# Patient Record
Sex: Male | Born: 1990
Health system: Southern US, Community
[De-identification: ages and names within clinical notes are randomized; demographics above are authoritative.]

## PROBLEM LIST (undated history)

## (undated) DIAGNOSIS — I10 Essential (primary) hypertension: Secondary | ICD-10-CM

---
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
## 2014-07-28 ENCOUNTER — Emergency Department (HOSPITAL_COMMUNITY): Payer: Managed Care, Other (non HMO)

## 2014-07-28 ENCOUNTER — Encounter (HOSPITAL_COMMUNITY): Payer: Self-pay

## 2014-07-28 ENCOUNTER — Emergency Department (HOSPITAL_COMMUNITY)
Admission: EM | Admit: 2014-07-28 | Discharge: 2014-07-28 | Disposition: A | Discharge status: Home / Self Care | Payer: Managed Care, Other (non HMO) | Attending: Emergency Medicine | Admitting: Emergency Medicine

## 2014-07-28 DIAGNOSIS — R61 Generalized hyperhidrosis: Secondary | ICD-10-CM | POA: Insufficient documentation

## 2014-07-28 DIAGNOSIS — R0602 Shortness of breath: Secondary | ICD-10-CM | POA: Insufficient documentation

## 2014-07-28 DIAGNOSIS — R062 Wheezing: Secondary | ICD-10-CM | POA: Insufficient documentation

## 2014-07-28 DIAGNOSIS — R079 Chest pain, unspecified: Secondary | ICD-10-CM

## 2014-07-28 DIAGNOSIS — I10 Essential (primary) hypertension: Secondary | ICD-10-CM | POA: Diagnosis not present

## 2014-07-28 DIAGNOSIS — Z720 Tobacco use: Secondary | ICD-10-CM | POA: Diagnosis not present

## 2014-07-28 HISTORY — DX: Essential (primary) hypertension: I10

## 2014-07-28 LAB — CBC
HCT: 49.2 % (ref 39.0–52.0)
Hemoglobin: 17.2 g/dL — ABNORMAL HIGH (ref 13.0–17.0)
MCH: 29.7 pg (ref 26.0–34.0)
MCHC: 35 g/dL (ref 30.0–36.0)
MCV: 84.8 fL (ref 78.0–100.0)
Platelets: 240 10*3/uL (ref 150–400)
RBC: 5.8 MIL/uL (ref 4.22–5.81)
RDW: 12.5 % (ref 11.5–15.5)
WBC: 10.7 10*3/uL — AB (ref 4.0–10.5)

## 2014-07-28 LAB — BASIC METABOLIC PANEL
ANION GAP: 16 — AB (ref 5–15)
BUN: 11 mg/dL (ref 6–20)
CALCIUM: 10.6 mg/dL — AB (ref 8.9–10.3)
CHLORIDE: 103 mmol/L (ref 101–111)
CO2: 22 mmol/L (ref 22–32)
CREATININE: 0.99 mg/dL (ref 0.61–1.24)
GFR calc Af Amer: >60 mL/min (ref >60)
GLUCOSE: 94 mg/dL (ref 65–99)
POTASSIUM: 3.4 mmol/L — AB (ref 3.5–5.1)
SODIUM: 141 mmol/L (ref 135–145)

## 2014-07-28 LAB — I-STAT TROPONIN, ED
TROPONIN I, POC: 0 ng/mL (ref 0.00–0.08)
Troponin i, poc: 0 ng/mL (ref 0.00–0.08)

## 2014-07-28 LAB — BRAIN NATRIURETIC PEPTIDE: B NATRIURETIC PEPTIDE 5: 16.3 pg/mL (ref 0.0–100.0)

## 2014-07-28 MED ORDER — MORPHINE SULFATE 4 MG/ML IJ SOLN
4.0000 mg | Freq: Once | INTRAMUSCULAR | Status: AC
  Administered 2014-07-28: 4 mg via INTRAVENOUS
  Filled 2014-07-28: qty 1

## 2014-07-28 MED ORDER — ALBUTEROL SULFATE (2.5 MG/3ML) 0.083% IN NEBU
2.5000 mg | INHALATION_SOLUTION | Freq: Once | RESPIRATORY_TRACT | Status: AC
  Administered 2014-07-28: 2.5 mg via RESPIRATORY_TRACT
  Filled 2014-07-28: qty 3

## 2014-07-28 MED ORDER — ASPIRIN 81 MG PO CHEW: 324.0000 mg | CHEWABLE_TABLET | Freq: Once | ORAL | Status: DC

## 2014-07-28 NOTE — Discharge Instructions (Signed)
Chest Pain (Nonspecific) Return for chest pain with shortness of breath or diaphoresis. Follow-up with a provider using the resource guide below. Take ibuprofen or Tylenol for pain. It is often hard to give a diagnosis for the cause of chest pain. There is always a chance that your pain could be related to something serious, such as a heart attack or a blood clot in the lungs. You need to follow up with your doctor. HOME CARE  If antibiotic medicine was given, take it as directed by your doctor. Finish the medicine even if you start to feel better.  For the next few days, avoid activities that bring on chest pain. Continue physical activities as told by your doctor.  Do not use any tobacco products. This includes cigarettes, chewing tobacco, and e-cigarettes.  Avoid drinking alcohol.  Only take medicine as told by your doctor.  Follow your doctor's suggestions for more testing if your chest pain does not go away.  Keep all doctor visits you made. GET HELP IF:  Your chest pain does not go away, even after treatment.  You have a rash with blisters on your chest.  You have a fever. GET HELP RIGHT AWAY IF:   You have more pain or pain that spreads to your arm, neck, jaw, back, or belly (abdomen).  You have shortness of breath.  You cough more than usual or cough up blood.  You have very bad back or belly pain.  You feel sick to your stomach (nauseous) or throw up (vomit).  You have very bad weakness.  You pass out (faint).  You have chills. This is an emergency. Do not wait to see if the problems will go away. Call your local emergency services (911 in U.S.). Do not drive yourself to the hospital. MAKE SURE YOU:   Understand these instructions.  Will watch your condition.  Will get help right away if you are not doing well or get worse. Document Released: 06/28/2007 Document Revised: 01/14/2013 Document Reviewed: 06/28/2007 [facility] Patient Information 2015 [place],  [place]. This information is not intended to replace advice given to you by your health care provider. Make sure you discuss any questions you have with your health care provider.  Emergency Department Resource Guide 1) Find a Doctor and Pay Out of Pocket Although you won't have to find out who is covered by your insurance plan, it is a good idea to ask around and get recommendations. You will then need to call the office and see if the doctor you have chosen will accept you as a new patient and what types of options they offer for patients who are self-pay. Some doctors offer discounts or will set up payment plans for their patients who do not have insurance, but you will need to ask so you aren't surprised when you get to your appointment.  2) Contact Your Local Health Department Not all health departments have doctors that can see patients for sick visits, but many do, so it is worth a call to see if yours does. If you don't know where your local health department is, you can check in your phone book. The CDC also has a tool to help you locate your state's health department, and many state websites also have listings of all of their local health departments.  3) Find a Walk-in Clinic If your illness is not likely to be very severe or complicated, you may want to try a walk in clinic. These are popping up all over  the country in pharmacies, drugstores, and shopping centers. They're usually staffed by nurse practitioners or physician assistants that have been trained to treat common illnesses and complaints. They're usually fairly quick and inexpensive. However, if you have serious medical issues or chronic medical problems, these are probably not your best option.  No Primary Care Doctor: - Call Health Connect at  [phone] - they can help you locate a primary care doctor that  accepts your insurance, provides certain services, etc. - Physician Referral Service- [phone]  Chronic Pain  Problems: Organization         Address  Phone   Notes  [facility]  [phone] Patients need to be referred by their primary care doctor.   Medication Assistance: Organization         Address  Phone   Notes  [facility]ication [facility] [address] [place], [place] [address] [phone]) [phone] --Must be a resident of [place] [facility] -- Must have NO insurance coverage whatsoever (no Medicaid/ Medicare, etc.) -- The pt. MUST have a primary care doctor that directs their care regularly and follows them in the community   MedAssist  [phone]   [employer]  [phone]    Agencies that provide inexpensive medical care: Organization         Address  Phone   Notes  [facility]  [phone]   [facility]    [phone]) [phone]   [facility] [address] [place], [place] [address] [phone]   [facility] [address], [place] [phone]) [phone]   Planned Parenthood    [phone]) [phone]   [facility]    [phone]   Community Health and [facility]  [address], [place] Phone:  [phone], Fax:  [phone] Hours of Operation:  9 am - 6 pm, M-F.  Also accepts Medicaid/Medicare and self-pay.  [facility]  [address], [place] Phone: [phone](336) [phone], Fax: [phone]) [phone]. Hours of Operation:  8:30 am - 5:30 pm, M-F.  Also accepts Medicaid and self-pay.  [facility] [place] [address], [place] Phone: [phone]) [phone]   [facility] [address], [place], [place] [phone], Ext. [phone] Mondays & Thursdays: 7-9 AM.  First 15 patients are seen on a first come, first serve basis.    Medicaid-accepting [facility] Providers:  Organization         Address  Phone   Notes  [facility] [address], [place] [phone]) [phone] Also  accepts self-pay patients.  [facility] [address] [place], [place]  [phone]   [facility] [address], [place] [phone]) [phone]   [facility] [address] [place] [address], [place] [phone]) [phone]   Renaye RakersVeita Bland [address], [place]   [phone]) [phone] Only accepts [place] Access [place] patients after they have their name applied to their card.   Self-Pay (no insurance) in [facility]:  Organization         Address  Phone   Notes  Sickle Cell Patients, [facility] [address], [place] [phone]) [phone]   [facility] [address], [place] [phone]   [facility] [place]  [address], [place] [phone]   [facility]/Dr. Osei-Bonsu  [address], [place] or [address], [place] [phone] Phone number for both [place] and [place] locations is the same.  [facility] [address], [place] [phone]   [facility] [address], [place] or [address] [phone]   [facility] [address] [place] [phone], phone; [phone], fax Sees patients 1st and 3rd Saturday of every month.  Must not qualify for public or private insurance (i.e. Medicaid, Medicare, [place] Health Choice, Veterans' Benefits)  Household income should be no more than 200% of the poverty level The clinic cannot treat you if you are pregnant or think you are pregnant  Sexually transmitted diseases are not treated at the clinic.    Dental Care: Organization         Address  Phone  Notes  [facility] of [facility] [address] [place], [place] [phone] Accepts children up to age 3 who are enrolled in [place] or [place] Health Choice; pregnant  women with a Medicaid card; and children who have applied for Medicaid or [place] Health Choice, but were declined, whose parents can pay a reduced fee at time of service.  [facility] of [facility]  [address], [place] [phone] Accepts children up to age 20 who are enrolled in [place] or [place] Health Choice; pregnant women with a Medicaid card; and children who have applied for Medicaid or  Health Choice, but were declined, whose parents can pay a reduced fee at time of service.  [facility]  [address] [place], [place] [phone] Patients are seen by appointment only. Walk-ins are not accepted. [facility] will see patients 75 years of age and older. Monday - Tuesday (8am-5pm) Most Wednesdays (8:30-5pm) $30 per visit, cash only  [facility] Adult Dental Access PROGRAM  [address], [facility] [phone] Patients are seen by appointment only. Walk-ins are not accepted. [facility] will see patients 49 years of age and older. One Wednesday Evening (Monthly: Volunteer Based).  $30 per visit, cash only  [employer]  [phone] for adults; Children under age 30, call Graduate Pediatric Dentistry at [phone]. Children aged 76-14, please call [phone] to request a pediatric application.  Dental services are provided in all areas of dental care including fillings, crowns and bridges, complete and partial dentures, implants, gum treatment, root canals, and extractions. Preventive care is also provided. Treatment is provided to both adults and children. Patients are selected via a lottery and there is often a waiting list.   [facility] [address] [place]  [phone] [email or web address]   [facility] [address] [employer], [place] [phone], Ext. [phone] Second and Fourth Thursday of each month, opens at 6:30 AM; Clinic ends at 9 AM.  Patients are  seen on a first-come first-served basis, and a limited number are seen during each clinic.   [facility]  [address] [place] [place], [place] [phone]   Eligibility Requirements You must have lived in [place], [place], or [place] counties for at least the last three months.   You cannot be eligible for state or federal sponsored [place], including [employer], [place], or [employer].  You generally cannot be eligible for healthcare insurance through your employer.    How to apply: Eligibility screenings are held every Tuesday and Wednesday afternoon from 1:00 pm until 4:00 pm. You do not need an appointment for the interview!  [facility] [address] [place], [place]   [place]  [place] Department  [place]  [phone]    Behavioral Health Resources in the Community: Intensive Outpatient Programs Organization         Address  Phone  Notes  [place]. [address] [place], [place] [phone]   [facility] Outpatient [address] [place], [place]   ADS: Alcohol & Drug Svcs [address], [place], [place]   [place] [address]  [place], [place] or [phone]   Substance Abuse Resources Organization         Address  Phone  Notes  Alcohol and Drug Services  [phone]   [place]  [phone]   The [facility]   Chinita Pester  [phone]   Residential & Outpatient Substance Abuse Program  [phone]   Psychological Services Organization         Address  Phone  Notes  [facility] [place]  [place]  [phone]   [place] [address] [place] or [phone]    Mobile Crisis  Teams Organization         Address  Phone  Notes  [facility], Mobile Crisis Care Unit  [phone]   [facility]  [address] [place], [place]   [place] Levels [address] [place] [phone]    Self-Help/Support Groups Organization         Address  Phone             Notes  [place]. of [place] - variety of support groups  [place] Call for more information  Narcotics Anonymous (NA), [facility] [address], [employer] [place]  2 meetings at this location   Special educational needs teacher         Address  Phone  Notes  [facility]AP Residential Treatment [place],    [place]  [phone]   [facility]  [address], [place] [phone], [place], [place]   [place], [place] [phone] Admissions: 8am-3pm M-F  Incentives Substance [place] [address],    [place], [place] [phone]   [facility] [address] [place], [place], [place]   [facility] [address]  [place], [place]   Insight Programs - Intensive Outpatient [address], [place], [place]   [facility] ([place].) [place].,  [place], [place] [phone] or [phone]   [facility] (RTS) [address], [place], [place] Accepts Medicaid  [facility] [address],  [place] [phone] Substance Abuse/Addiction Treatment   [facility] Organization         Address  Phone  Notes  [facility]  [phone]   Domenic Schwab, PhD [address] [place], [place]   [phone] or [phone]   [facility]   [phone]  [address] [place], [place] [phone]   [facility] [address] [place], [place] [phone]  Insurance/Medicaid/sponsorship through [facility] [address], [address] [place], [place] [phone] [place], [place] [phone]    Dr. Adele Schilder  [phone]   [facility] Dept. 1) [address], [place] 2) [place] 3)  [place] [address], [place] [phone]  [phone]   [place] [phone] or [phone] (After Hours)

## 2014-07-28 NOTE — Progress Notes (Signed)
EDCM spoke to patient at bedside.  Patient confirms he has [place] insurance without a pcp.  EDCM provided patient with list of pcps who accept patient's insurance within a ten mile radius of patient's zip code.  Discussed importance and purpose of having a pcp.  Patient thankful for resources.  No further EDCM needs at this time.

## 2014-07-28 NOTE — ED Notes (Signed)
Pt c/o intermittent central chest pain, SOB, and lightheadedness starting around 1030.  Pain score 10/10.  Pt has not taken anything for pain.  Pt reports working outside when pain started.  Hx of smoking and HTN.

## 2014-07-28 NOTE — ED Provider Notes (Signed)
CSN: [id number]    [id number] Arrival date & time 07/28/14  1409 History   First MD Initiated Contact with Patient 07/28/14 1604     Chief Complaint  Patient presents with  . Chest Pain  . Shortness of Breath     (Consider location/radiation/quality/duration/timing/severity/associated sxs/prior Treatment) Patient is a 24 y.o. male presenting with chest pain and shortness of breath. The history is provided by the patient. No language interpreter was used.  Chest Pain Associated symptoms: diaphoresis and shortness of breath   Associated symptoms: no abdominal pain, no nausea, not vomiting and no weakness   Shortness of Breath Associated symptoms: chest pain and diaphoresis   Associated symptoms: no abdominal pain and no vomiting   Marc Hebert is a 24 year old male with a history of hypertension who presents for chest pain that began at 10:30AM this morning while at work. He states it came on suddenly and was 10 out of 10. He states he was also diaphoretic and short of breath. The pain is nonradiating but constant in the center of his chest. He states that while waiting in the lobby the pain went down to a 6 out of 10. He denies taking anything prior to arrival. He denies that it is worse with breathing but does state that it is worse with movement. He denies any fever, chills, recent illness, cough, abdominal pain, nausea, vomiting, diarrhea, leg swelling. He smokes one pack per day of cigarettes. No family cardiac history. No recent travel or surgery. No history of DVT or PE. No history of asthma. He does not take any medication for hypertension.  Past Medical History  Diagnosis Date  . Hypertension    History reviewed. No pertinent past surgical history. History reviewed. No pertinent family history. History  Substance Use Topics  . Smoking status: Current Every Day Smoker -- 1.00 packs/day    Types: Cigarettes  . Smokeless tobacco: Not on file  . Alcohol Use: Yes     Comment: rarely     Review of Systems  Constitutional: Positive for diaphoresis.  Respiratory: Positive for shortness of breath.   Cardiovascular: Positive for chest pain.  Gastrointestinal: Negative for nausea, vomiting and abdominal pain.  Neurological: Negative for weakness.  All other systems reviewed and are negative.     Allergies  Review of patient's allergies indicates no known allergies.  Home Medications   Prior to Admission medications   Not on File   BP 110/90 mmHg  Pulse 92  Temp(Src) 98.9 F (37.2 C) (Oral)  Resp 18  SpO2 98% Physical Exam  Constitutional: He is oriented to person, place, and time. He appears well-developed and well-nourished.  HENT:  Head: Normocephalic and atraumatic.  Eyes: Conjunctivae are normal.  Neck: Normal range of motion. Neck supple.  Cardiovascular: Normal rate, regular rhythm and normal heart sounds.   Pulmonary/Chest: Effort normal. No accessory muscle usage. No respiratory distress. He has no decreased breath sounds. He has wheezes. He has no rales.  Wheezing throughout bilateral lung fields.   Abdominal: Soft. There is no tenderness.  Musculoskeletal: Normal range of motion. He exhibits no edema.  No calf tenderness.   Neurological: He is alert and oriented to person, place, and time.  Skin: Skin is warm and dry.  Psychiatric: He has a normal mood and affect. His behavior is normal.  Nursing note and vitals reviewed.   ED Course  Procedures (including critical care time) Labs Review Labs Reviewed  CBC - Abnormal; Notable for the following:  WBC 10.7 (*)    Hemoglobin 17.2 (*)    All other components within normal limits  BASIC METABOLIC PANEL - Abnormal; Notable for the following:    Potassium 3.4 (*)    Calcium 10.6 (*)    Anion gap 16 (*)    All other components within normal limits  BRAIN NATRIURETIC PEPTIDE  I-STAT TROPOININ, ED  Rosezena Sensor, ED    Imaging Review Dg Chest 2 View  07/28/2014   CLINICAL DATA:   Patient with intermittent chest pain and shortness of breath.  EXAM: CHEST  2 VIEW  COMPARISON:  None.  FINDINGS: Normal cardiac and mediastinal contours. No consolidative pulmonary opacities. No pleural effusion or pneumothorax. Regional skeleton is unremarkable. Bilateral nipple piercing.  IMPRESSION: No acute cardiopulmonary process.   Electronically Signed   By: Annia Belt M.D.   On: 07/28/2014 17:13   EKG Interpretation 07/28/14 14:20:20 Vent. rate 98 BPM PR interval 136 ms QRS duration 79 ms QT/QTc 335/428 ms P-R-T axes 41 39 71 Normal sinus rhythm. I agree with the EKG interpretation, Catha Gosselin, PA-C. MDM   Final diagnoses:  Chest pain, unspecified chest pain type   98% oxygen on room air. Not tachycardic or afebrile. Labs are not concerning. BMP is negative for CHF. Troponin is negative. EKG is not concerning for WPW. Repeat troponin is also negative.  Patient is PERC negative. His pain is worse with movement which is atypical for cardiac pain. I reviewed the heart score which puts him at low probability for a major cardiac event. I discussed return precautions with him such as chest pain with shortness of breath and diaphoresis. Patient verbally agrees with the plan.  Medications  albuterol (PROVENTIL) (2.5 MG/3ML) 0.083% nebulizer solution 2.5 mg (2.5 mg Nebulization Given 07/28/14 1630)  morphine 4 MG/ML injection 4 mg (4 mg Intravenous Given 07/28/14 1750)       Catha Gosselin, PA-C 07/29/14 0215  Derwood Kaplan, MD 07/29/14 1610

## 2015-12-07 ENCOUNTER — Emergency Department (HOSPITAL_COMMUNITY): Payer: Managed Care, Other (non HMO)

## 2015-12-07 ENCOUNTER — Emergency Department (HOSPITAL_COMMUNITY)
Admission: EM | Admit: 2015-12-07 | Discharge: 2015-12-08 | Disposition: A | Discharge status: Home / Self Care | Payer: Managed Care, Other (non HMO) | Attending: Emergency Medicine | Admitting: Emergency Medicine

## 2015-12-07 ENCOUNTER — Encounter (HOSPITAL_COMMUNITY): Payer: Self-pay | Admitting: Emergency Medicine

## 2015-12-07 DIAGNOSIS — F17220 Nicotine dependence, chewing tobacco, uncomplicated: Secondary | ICD-10-CM | POA: Insufficient documentation

## 2015-12-07 DIAGNOSIS — I10 Essential (primary) hypertension: Secondary | ICD-10-CM | POA: Insufficient documentation

## 2015-12-07 DIAGNOSIS — Y92410 Unspecified street and highway as the place of occurrence of the external cause: Secondary | ICD-10-CM | POA: Insufficient documentation

## 2015-12-07 DIAGNOSIS — Y999 Unspecified external cause status: Secondary | ICD-10-CM | POA: Insufficient documentation

## 2015-12-07 DIAGNOSIS — F17210 Nicotine dependence, cigarettes, uncomplicated: Secondary | ICD-10-CM | POA: Insufficient documentation

## 2015-12-07 DIAGNOSIS — Y9389 Activity, other specified: Secondary | ICD-10-CM | POA: Insufficient documentation

## 2015-12-07 DIAGNOSIS — S8001XA Contusion of right knee, initial encounter: Secondary | ICD-10-CM

## 2015-12-07 MED ORDER — IBUPROFEN 800 MG PO TABS
800.0000 mg | ORAL_TABLET | Freq: Once | ORAL | Status: AC
  Administered 2015-12-07: 800 mg via ORAL
  Filled 2015-12-07: qty 1

## 2015-12-07 MED ORDER — HYDROCODONE-ACETAMINOPHEN 5-325 MG PO TABS
1.0000 | ORAL_TABLET | Freq: Once | ORAL | Status: AC
  Administered 2015-12-07: 1 via ORAL
  Filled 2015-12-07: qty 1

## 2015-12-07 MED ORDER — METHOCARBAMOL 500 MG PO TABS
1000.0000 mg | ORAL_TABLET | Freq: Once | ORAL | Status: AC
  Administered 2015-12-07: 1000 mg via ORAL
  Filled 2015-12-07: qty 2
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
## 2015-12-08 MED ORDER — METHOCARBAMOL 500 MG PO TABS: 500.0000 mg | ORAL_TABLET | Freq: Three times a day (TID) | ORAL | 0 refills | Status: DC

## 2015-12-08 MED ORDER — IBUPROFEN 600 MG PO TABS: 600.0000 mg | ORAL_TABLET | Freq: Four times a day (QID) | ORAL | 1 refills | Status: DC | PRN

## 2015-12-08 NOTE — ED Notes (Signed)
Pt alert & oriented x4, stable gait. Patient given discharge instructions, paperwork & prescription(s). Patient  instructed to stop at the registration desk to finish any additional paperwork. Patient verbalized understanding. Pt left department w/ no further questions. 

## 2015-12-08 NOTE — Discharge Instructions (Signed)
Your xray of the right knee is negative for fracture or dislocation. No fluid in the joint. Please use ibuprofen every 6 hours. Use robaxin for muscle strain and muscle contraction headache.This medication may cause drowsiness. Please do not drink, drive, or participate in activity that requires concentration while taking this medication.

## 2016-05-31 ENCOUNTER — Inpatient Hospital Stay (HOSPITAL_COMMUNITY)
Admission: EM | Admit: 2016-05-31 | Discharge: 2016-06-02 | DRG: 159 | Disposition: A | Discharge status: Home / Self Care | Payer: Self-pay | Attending: Student in an Organized Health Care Education/Training Program | Admitting: Student in an Organized Health Care Education/Training Program

## 2016-05-31 ENCOUNTER — Emergency Department (HOSPITAL_COMMUNITY): Payer: Self-pay

## 2016-05-31 ENCOUNTER — Encounter (HOSPITAL_COMMUNITY): Payer: Self-pay | Admitting: Emergency Medicine

## 2016-05-31 DIAGNOSIS — F17290 Nicotine dependence, other tobacco product, uncomplicated: Secondary | ICD-10-CM

## 2016-05-31 DIAGNOSIS — F17210 Nicotine dependence, cigarettes, uncomplicated: Secondary | ICD-10-CM | POA: Diagnosis present

## 2016-05-31 DIAGNOSIS — J390 Retropharyngeal and parapharyngeal abscess: Secondary | ICD-10-CM

## 2016-05-31 DIAGNOSIS — L0291 Cutaneous abscess, unspecified: Secondary | ICD-10-CM

## 2016-05-31 DIAGNOSIS — Z806 Family history of leukemia: Secondary | ICD-10-CM

## 2016-05-31 DIAGNOSIS — Z8249 Family history of ischemic heart disease and other diseases of the circulatory system: Secondary | ICD-10-CM

## 2016-05-31 DIAGNOSIS — I10 Essential (primary) hypertension: Secondary | ICD-10-CM | POA: Diagnosis present

## 2016-05-31 DIAGNOSIS — R252 Cramp and spasm: Secondary | ICD-10-CM

## 2016-05-31 DIAGNOSIS — K047 Periapical abscess without sinus: Principal | ICD-10-CM | POA: Diagnosis present

## 2016-05-31 DIAGNOSIS — Z8269 Family history of other diseases of the musculoskeletal system and connective tissue: Secondary | ICD-10-CM

## 2016-05-31 DIAGNOSIS — R229 Localized swelling, mass and lump, unspecified: Secondary | ICD-10-CM

## 2016-05-31 DIAGNOSIS — E669 Obesity, unspecified: Secondary | ICD-10-CM | POA: Diagnosis present

## 2016-05-31 DIAGNOSIS — Z6828 Body mass index (BMI) 28.0-28.9, adult: Secondary | ICD-10-CM

## 2016-05-31 DIAGNOSIS — F17220 Nicotine dependence, chewing tobacco, uncomplicated: Secondary | ICD-10-CM | POA: Diagnosis present

## 2016-05-31 LAB — MRSA PCR SCREENING: MRSA BY PCR: NEGATIVE

## 2016-05-31 LAB — I-STAT CHEM 8, ED
BUN: 9 mg/dL (ref 6–20)
Calcium, Ion: 1.1 mmol/L — ABNORMAL LOW (ref 1.15–1.40)
Chloride: 99 mmol/L — ABNORMAL LOW (ref 101–111)
Creatinine, Ser: 1.1 mg/dL (ref 0.61–1.24)
Glucose, Bld: 101 mg/dL — ABNORMAL HIGH (ref 65–99)
HEMATOCRIT: 47 % (ref 39.0–52.0)
HEMOGLOBIN: 16 g/dL (ref 13.0–17.0)
Potassium: 3.8 mmol/L (ref 3.5–5.1)
SODIUM: 139 mmol/L (ref 135–145)
TCO2: 29 mmol/L (ref 0–100)

## 2016-05-31 LAB — CBC WITH DIFFERENTIAL/PLATELET
BASOS ABS: 0 10*3/uL (ref 0.0–0.1)
BASOS PCT: 0 %
Eosinophils Absolute: 0 10*3/uL (ref 0.0–0.7)
Eosinophils Relative: 0 %
HEMATOCRIT: 46.6 % (ref 39.0–52.0)
Hemoglobin: 16.2 g/dL (ref 13.0–17.0)
Lymphocytes Relative: 16 %
Lymphs Abs: 1.9 10*3/uL (ref 0.7–4.0)
MCH: 30.9 pg (ref 26.0–34.0)
MCHC: 34.8 g/dL (ref 30.0–36.0)
MCV: 88.8 fL (ref 78.0–100.0)
MONO ABS: 1.8 10*3/uL — AB (ref 0.1–1.0)
Monocytes Relative: 14 %
NEUTROS ABS: 8.7 10*3/uL — AB (ref 1.7–7.7)
NEUTROS PCT: 70 %
Platelets: 201 10*3/uL (ref 150–400)
RBC: 5.25 MIL/uL (ref 4.22–5.81)
RDW: 12.7 % (ref 11.5–15.5)
WBC: 12.4 10*3/uL — AB (ref 4.0–10.5)

## 2016-05-31 MED ORDER — ENOXAPARIN SODIUM 40 MG/0.4ML ~~LOC~~ SOLN
40.0000 mg | SUBCUTANEOUS | Status: DC
  Filled 2016-05-31 (×3): qty 0.4

## 2016-05-31 MED ORDER — ONDANSETRON HCL 4 MG/2ML IJ SOLN: 4.0000 mg | Freq: Four times a day (QID) | INTRAMUSCULAR | Status: DC | PRN

## 2016-05-31 MED ORDER — HYDROMORPHONE HCL 1 MG/ML IJ SOLN
0.5000 mg | INTRAMUSCULAR | Status: DC | PRN
  Administered 2016-05-31 – 2016-06-02 (×11): 0.5 mg via INTRAVENOUS
  Filled 2016-05-31 (×11): qty 1

## 2016-05-31 MED ORDER — KETOROLAC TROMETHAMINE 30 MG/ML IJ SOLN
30.0000 mg | Freq: Four times a day (QID) | INTRAMUSCULAR | Status: DC | PRN
Start: 2016-05-31 — End: 2016-06-02
  Administered 2016-05-31 – 2016-06-02 (×7): 30 mg via INTRAVENOUS
  Filled 2016-05-31 (×7): qty 1

## 2016-05-31 MED ORDER — KETOROLAC TROMETHAMINE 15 MG/ML IJ SOLN: 15.0000 mg | Freq: Once | INTRAMUSCULAR | Status: DC

## 2016-05-31 MED ORDER — IOPAMIDOL (ISOVUE-300) INJECTION 61%
INTRAVENOUS | Status: AC
  Administered 2016-05-31: 75 mL
  Filled 2016-05-31: qty 75

## 2016-05-31 MED ORDER — DEXAMETHASONE SODIUM PHOSPHATE 10 MG/ML IJ SOLN
10.0000 mg | Freq: Once | INTRAMUSCULAR | Status: AC
  Administered 2016-05-31: 10 mg via INTRAVENOUS
  Filled 2016-05-31: qty 1

## 2016-05-31 MED ORDER — SODIUM CHLORIDE 0.9 % IV BOLUS (SEPSIS)
1000.0000 mL | Freq: Once | INTRAVENOUS | Status: AC
Start: 2016-05-31 — End: 2016-05-31
  Administered 2016-05-31: 1000 mL via INTRAVENOUS

## 2016-05-31 MED ORDER — KCL IN DEXTROSE-NACL 20-5-0.45 MEQ/L-%-% IV SOLN
INTRAVENOUS | Status: AC
  Administered 2016-05-31 (×2): via INTRAVENOUS
  Filled 2016-05-31 (×4): qty 1000

## 2016-05-31 MED ORDER — MORPHINE SULFATE (PF) 4 MG/ML IV SOLN
8.0000 mg | Freq: Once | INTRAVENOUS | Status: AC
  Administered 2016-05-31: 8 mg via INTRAVENOUS
  Filled 2016-05-31: qty 2

## 2016-05-31 MED ORDER — SODIUM CHLORIDE 0.9% FLUSH
3.0000 mL | Freq: Two times a day (BID) | INTRAVENOUS | Status: DC
  Administered 2016-05-31 (×2): 3 mL via INTRAVENOUS

## 2016-05-31 MED ORDER — ONDANSETRON HCL 4 MG PO TABS: 4.0000 mg | ORAL_TABLET | Freq: Four times a day (QID) | ORAL | Status: DC | PRN

## 2016-05-31 MED ORDER — CLINDAMYCIN PHOSPHATE 600 MG/50ML IV SOLN
600.0000 mg | Freq: Once | INTRAVENOUS | Status: AC
  Administered 2016-05-31: 600 mg via INTRAVENOUS
  Filled 2016-05-31: qty 50

## 2016-05-31 MED ORDER — SENNOSIDES-DOCUSATE SODIUM 8.6-50 MG PO TABS
1.0000 | ORAL_TABLET | Freq: Every evening | ORAL | Status: DC | PRN
  Filled 2016-05-31: qty 1

## 2016-05-31 MED ORDER — DEXAMETHASONE SODIUM PHOSPHATE 4 MG/ML IJ SOLN
4.0000 mg | Freq: Four times a day (QID) | INTRAMUSCULAR | Status: DC
  Administered 2016-05-31 – 2016-06-01 (×4): 4 mg via INTRAVENOUS
  Filled 2016-05-31 (×4): qty 1

## 2016-05-31 MED ORDER — ACETAMINOPHEN 650 MG RE SUPP: 650.0000 mg | Freq: Four times a day (QID) | RECTAL | Status: DC | PRN

## 2016-05-31 MED ORDER — SODIUM CHLORIDE 0.9 % IV SOLN
3.0000 g | Freq: Four times a day (QID) | INTRAVENOUS | Status: DC
  Administered 2016-05-31 – 2016-06-02 (×8): 3 g via INTRAVENOUS
  Filled 2016-05-31 (×10): qty 3

## 2016-05-31 MED ORDER — ACETAMINOPHEN 325 MG PO TABS: 650.0000 mg | ORAL_TABLET | Freq: Four times a day (QID) | ORAL | Status: DC | PRN

## 2016-05-31 NOTE — ED Notes (Signed)
Attempted report 

## 2016-05-31 NOTE — H&P (Signed)
Date: 05/31/2016               Patient Name:  Marc NieceMatthew G Bonzo MRN: [id number]  DOB: 04/27/1990 Age / Sex: 26 y.o., male   PCP: Patient, No Pcp Per         Medical Service: Internal Medicine Teaching Service         Attending Physician: Dr. Oswaldo DoneVincent, Marquita Palmsuncan Thomas, *    First Contact: Dr. Samuella CotaSvalina Pager: [phone]  Second Contact: Dr. Dimple Caseyice Pager: [phone]       After Hours (After 5p/  First Contact Pager: [phone]  weekends / holidays): Second Contact Pager: [phone]   Chief Complaint: left lower jaw pain  History of Present Illness: 26 year old man with history of HTN, tobacco use presenting with dental pain x 2.5 days. Denies pain similar to this before. Located along the left lower jaw. Has swelling along that area as well. Cannot open mouth wide for the last 1.5 days. No relieving factors. Trying to open his mouth makes it worse. Also hurts with coughing and swallowing. No fevers. He has chills. He has had dental pain overall for the last 2-3 years but the pain has been intermittent.  No vision changes. No dyspnea, chest pain, nausea/vomiting, abdominal pain, diarrhea. No melena or hematochezia. No dysuria. No rash. No paresthesias.  Meds:  No home medications  Allergies: Allergies as of 05/31/2016  . (No Known Allergies)   Past Medical History:  Diagnosis Date  . Hypertension     Family History:  Mother is alive. She has HTN, OA. Father is alive. He has HTN. Brother is alive and has HTN. 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 He also uses chew.  Drinks 24 pack of beer every weekend.  Denies illicits  Review of Systems: A complete ROS was negative except as per HPI.   Physical Exam: Blood pressure 131/85, pulse 76, temperature 98.6 F (37 C), temperature source Oral, resp. rate 18, height 6\' 3"  (1.905 m), weight 215 lb (97.5 kg), SpO2 95 %. General Apperance: NAD Head:  Normocephalic, atraumatic Eyes: PERRL, EOMI, anicteric sclera Ears: Normal external ear canal Nose: Nares normal, septum midline, mucosa normal Throat: Lips, mucosa and tongue normal. Left lower posterior molar with caries. Neck: Supple, trachea midline, tender to palpation left lower mandible and submandibular space Back: No tenderness or bony abnormality  Lungs: Clear to auscultation bilaterally. No wheezes, rhonchi or rales. Breathing comfortably Chest Wall: Nontender, no deformity Heart: Regular rate and rhythm, no murmur/rub/gallop Abdomen: Soft, nontender, nondistended, no rebound/guarding Extremities: Normal, atraumatic, warm and well perfused, no edema Pulses: 2+ throughout Skin: No rashes or lesions Neurologic: Alert and oriented x 3. CNII-XII intact. Normal strength and sensation  Labs  CBC    Component Value Date/Time   WBC 12.4 (H) 05/31/2016 0347   RBC 5.25 05/31/2016 0347   HGB 16.0 05/31/2016 0353   HCT 47.0 05/31/2016 0353   PLT 201 05/31/2016 0347   MCV 88.8 05/31/2016 0347   MCH 30.9 05/31/2016 0347   MCHC 34.8 05/31/2016 0347   RDW 12.7 05/31/2016 0347   LYMPHSABS 1.9 05/31/2016 0347   MONOABS 1.8 (H) 05/31/2016 0347   EOSABS 0.0 05/31/2016 0347   BASOSABS 0.0 05/31/2016 0347   Imaging CT soft tissue neck w contrast 5/9: Left posterior mandibular molar periapical lucency with defect of the inner table cortex. Extensive inflammation throughout the left upper neck  involving the left masticator space, parapharyngeal space, sublingual space, submandibular space, and subcutaneous fat of the left anterior neck. No rim enhancing abscess is identified at this time. Left submandibular and upper cervical lymphadenopathy as well as mild enhancement of left submandibular gland.  Assessment & Plan by Problem: 26 year old man with history of HTN, tobacco use presenting with dental pain x 2.5 days.  Odontogenic Infection with Trismus: Afebrile and hemodynamically stable.  He has a leukocytosis to 12.4. CT soft tissue neck with left posterior mandibular molar periapical lucency with defect of inner table cortex. Extensive inflammation throughout left upper neck. ED spoke to Dr. Leanord Asal, Dentistry. Recommends admission and IV antibiotics. Can be seen in his clinic after 2 days of abx and once swelling has improved where there is no trismus. Seen by CCM and felt patient can be admitted to SDU with close airway monitoring. He was given one dose of IV clindamycin and Decadron by ED. -Admit to SDU -Unasyn 3g q6hr -Decadron 4mg  q6hr -HIV screen -D5 1/2NS + 20k @ 162ml/hr -Toradol 30mg  q6hr prn severe pain, dilaudid 0.5mg  q4hr prn breakthrough pain  HTN: Not on any medications at home. Initially 170/99 in ED. Normotensive presently. Continue to monitor  Tobacco use: Cessation counseling.  FEN: Clears VTE ppx: Lovenox Code: FULL  Dispo: Admit patient to Inpatient with expected length of stay greater than 2 midnights.  Signed: Lora Paula, MD 05/31/2016, 7:42 AM  Pager: [phone]

## 2016-05-31 NOTE — ED Triage Notes (Signed)
Pt reports left sided dental pain that has caused him pain for 2 days.  Pt states that he is unable to open his mouth, eat or sleep due to the pain.

## 2016-05-31 NOTE — ED Notes (Signed)
Admitting at bedside 

## 2016-05-31 NOTE — ED Provider Notes (Signed)
[facility]-EMERGENCY DEPT Provider Note   CSN: [id number] Arrival date & time: 05/31/16  0230  By signing my name below, I, Rosana Fret, attest that this documentation has been prepared under the direction and in the presence of Derwood Kaplan, MD. Electronically Signed: Rosana Fret, ED Scribe. 05/31/16. 3:40 AM.  History   Chief Complaint Chief Complaint  Patient presents with  . Dental Pain   The history is provided by the patient. No language interpreter was used.   HPI Comments: Marc Hebert is a 26 y.o. male who presents to the Emergency Department complaining of constant moderate left-sided dental pain onset 2 days ago. Pt describes his pain as gradually worsening and there is left-sided facial swelling. Pt states that he is unable to open his mouth, eat, drink water or sleep due to the pain. Pt states he believes his pain is being caused by wisdom teeth and he needs wisdom tooth extraction.. Pt reports no sensitivity to hot or cold temperature. Pt reports hx of HTN. Pt denies illicit drug use, smoking, sore throat, drooling blurry vision, nausea, vomiting, diarrhea, fever, chills, trouble swallowing, dysuria, numbness or tingling.   Past Medical History:  Diagnosis Date  . Hypertension     There are no active problems to display for this patient.   History reviewed. No pertinent surgical history.     Home Medications    Prior to Admission medications   Medication Sig Start Date End Date Taking? Authorizing Provider  ibuprofen (ADVIL,MOTRIN) 600 MG tablet Take 1 tablet (600 mg total) by mouth every 6 (six) hours as needed. Patient not taking: Reported on 05/31/2016 12/08/15   Ivery Quale, PA-C  methocarbamol (ROBAXIN) 500 MG tablet Take 1 tablet (500 mg total) by mouth 3 (three) times daily. Patient not taking: Reported on 05/31/2016 12/08/15   Ivery Quale, PA-C    Family History Family History  Problem Relation Age of Onset  . Hypertension Mother   .  Hypertension Father   . Hypertension Brother   . Hypertension Other     Social History Social History  Substance Use Topics  . Smoking status: Current Every Day Smoker    Packs/day: 0.50    Types: Cigarettes  . Smokeless tobacco: Current User    Types: Chew  . Alcohol use Yes     Comment: 2x week     Allergies   Patient has no known allergies.   Review of Systems Review of Systems  Constitutional: Negative for chills and fever.  HENT: Positive for dental problem and facial swelling. Negative for drooling, sore throat and trouble swallowing.   Eyes: Negative for visual disturbance.  Gastrointestinal: Negative for diarrhea, nausea and vomiting.  Genitourinary: Negative for dysuria.  Neurological: Negative for numbness.     Physical Exam Updated Vital Signs BP (!) 170/99 (BP Location: Right Arm)   Pulse 100   Temp 98.6 F (37 C) (Oral)   Resp 18   Ht 6\' 3"  (1.905 m)   Wt 215 lb (97.5 kg)   SpO2 98%   BMI 26.87 kg/m   Physical Exam  Constitutional: He is oriented to person, place, and time. He appears well-developed and well-nourished.  HENT:  Head: Normocephalic and atraumatic.  Tooth #17 has erosion.  Gingival exam: around the left lower molar teeth reveal edema and TTP, however there is no fluctuance or a pocket of abscess that was appreciated.   Eyes: Conjunctivae are normal.  Neck:  Pt has trismus  Cardiovascular: Normal rate.  Pulmonary/Chest: Effort normal.  Musculoskeletal: Normal range of motion.  Neurological: He is alert and oriented to person, place, and time.  Skin: Skin is warm and dry.  Psychiatric: He has a normal mood and affect.  Nursing note and vitals reviewed.    ED Treatments / Results  DIAGNOSTIC STUDIES: Oxygen Saturation is 98% on RA, normal by my interpretation.   COORDINATION OF CARE: 3:43 AM-Discussed next steps with pt. Pt verbalized understanding and is agreeable with the plan.   Labs (all labs ordered are listed,  but only abnormal results are displayed) Labs Reviewed  CBC WITH DIFFERENTIAL/PLATELET - Abnormal; Notable for the following:       Result Value   WBC 12.4 (*)    Neutro Abs 8.7 (*)    Monocytes Absolute 1.8 (*)    All other components within normal limits  I-STAT CHEM 8, ED - Abnormal; Notable for the following:    Chloride 99 (*)    Glucose, Bld 101 (*)    Calcium, Ion 1.10 (*)    All other components within normal limits    EKG  EKG Interpretation None       Radiology Ct Soft Tissue Neck W Contrast  Result Date: 05/31/2016 CLINICAL DATA:  26 y/o M; left lower teeth pain and left neck pain. Trismus. EXAM: CT NECK WITH CONTRAST TECHNIQUE: Multidetector CT imaging of the neck was performed using the standard protocol following the bolus administration of intravenous contrast. CONTRAST:  75mL ISOVUE-300 IOPAMIDOL (ISOVUE-300) INJECTION 61% COMPARISON:  None. FINDINGS: Pharynx and larynx: The oropharynx is mildly displaced rightward due to mass effect from inflammation in the left neck. Salivary glands: Mild enhancement of the left submandibular gland probably represents reactive inflammation. Thyroid: Normal. Lymph nodes: Left submandibular and upper cervical lymphadenopathy is likely reactive. Vascular: Negative. Limited intracranial: Negative. Visualized orbits: Negative. Mastoids and visualized paranasal sinuses: Right maxillary sinus mucous retention cyst. Skeleton: Periapical cysts of the left posterior most mandibular molar with a defect in the inner table cortex compatible with odontogenic disease (series 3, image 30). Upper chest: Negative. Other: There is edema within the left masticator space, left parapharyngeal space, left sublingual space, left submandibular space, extending into left anterior subcutaneous fat. IMPRESSION: 1. Left posterior mandibular molar periapical lucency with defect of the inner table cortex compatible with odontogenic disease. This is the probable source of  infection. 2. Extensive inflammation throughout the left upper neck involving the left masticator space, parapharyngeal space, sublingual space, submandibular space, and subcutaneous fat of the left anterior neck. No rim enhancing abscess is identified at this time. 3. Left submandibular and upper cervical lymphadenopathy as well as mild enhancement of left submandibular gland is likely reactive. These results were called by telephone at the time of interpretation on 05/31/2016 at 5:33 am to Dr. Derwood Kaplan , who verbally acknowledged these results. Electronically Signed   By: Mitzi Hansen M.D.   On: 05/31/2016 05:36    Procedures Procedures (including critical care time) CRITICAL CARE Performed by: Derwood Kaplan   Total critical care time: 40 minutes  Critical care time was exclusive of separately billable procedures and treating other patients.  Critical care was necessary to treat or prevent imminent or life-threatening deterioration.  Critical care was time spent personally by me on the following activities: development of treatment plan with patient and/or surrogate as well as nursing, discussions with consultants, evaluation of patient's response to treatment, examination of patient, obtaining history from patient or surrogate, ordering and performing treatments and  interventions, ordering and review of laboratory studies, ordering and review of radiographic studies, pulse oximetry and re-evaluation of patient's condition.    Medications Ordered in ED Medications  morphine 4 MG/ML injection 8 mg (8 mg Intravenous Given 05/31/16 0354)  sodium chloride 0.9 % bolus 1,000 mL (0 mLs Intravenous Stopped 05/31/16 0541)  iopamidol (ISOVUE-300) 61 % injection (75 mLs  Contrast Given 05/31/16 0447)  clindamycin (CLEOCIN) IVPB 600 mg (0 mg Intravenous Stopped 05/31/16 0646)  dexamethasone (DECADRON) injection 10 mg (10 mg Intravenous Given 05/31/16 [id number])     Initial Impression /  Assessment and Plan / ED Course  I have reviewed the triage vital signs and the nursing notes.  Pertinent labs & imaging results that were available during my care of the patient were reviewed by me and considered in my medical decision making (see chart for details).     Pt comes in with cc of neck pain, toothache. Pt has trismus. CT scan ordered and shows phlegmon with edema over the throat. Slight intrusion onto the airway per CT, but there is no stridor, resp distress.  I spoke with Dr. Leanord AsalFarless, Dentistry. He recommends that we admit patient and give IVAB. Pt can be seen in his clinic after 2 days of antibiotics, and once swelling has improved to the point where there is no trismus. Steroids and clinda given.  CCM saw the patient-  And feel patient can be admitted to stepdown with close airway monitoring.  Final Clinical Impressions(s) / ED Diagnoses   Final diagnoses:  Dental infection  Soft tissue swelling  Cellulitis of parapharyngeal space  Phlegmonous cellulitis    New Prescriptions New Prescriptions   No medications on file   I personally performed the services described in this documentation, which was scribed in my presence. The recorded information has been reviewed and is accurate.     Derwood KaplanNanavati, Antino Mayabb, MD 05/31/16 [phone]

## 2016-05-31 NOTE — Consult Note (Signed)
Name: Willeen NieceMatthew G Rattigan MRN: [id number] DOB: 03/22/1990    ADMISSION DATE:  05/31/2016 CONSULTATION DATE:  05/31/2016  REFERRING MD :  Dr. Craige CottaNavnavait, EDP  CHIEF COMPLAINT:  Dental Pain/Swelling   HISTORY OF PRESENT ILLNESS:   26 year old male with PMH of HTN. Presents to ED on 5/9 with reported 2 days dental pain/left-sided facial swelling. Patient reports he does not have a dentist and has not been for follow up in a while (unsure time frame). Reports that pain has gradually worsened over the left few days and now reports that he is unable to eat or drink due to pain. Denies difficulty breathing. Upon examination left lower molar has erosion and edema. PCCM was asked to consult regarding airway protection.    SIGNIFICANT EVENTS  5/9 > Presents to ED   STUDIES:  CT Neck 5/9 > Left posterior mandibular molar periapical lucency with defect of the inner table cortex compatible with odontogenic disease. Extensive inflammation throughout the left upper neck involving the left masticator space, parapharyngeal space, sublingual space, submandibular space, and subcutaneous fat of the left anterior neck , left submandibular gland is likely reactive    PAST MEDICAL HISTORY :   has a past medical history of Hypertension.  has no past surgical history on file. Prior to Admission medications   Medication Sig Start Date End Date Taking? Authorizing Provider  ibuprofen (ADVIL,MOTRIN) 600 MG tablet Take 1 tablet (600 mg total) by mouth every 6 (six) hours as needed. Patient not taking: Reported on 05/31/2016 12/08/15   Ivery QualeBryant, Hobson, PA-C  methocarbamol (ROBAXIN) 500 MG tablet Take 1 tablet (500 mg total) by mouth 3 (three) times daily. Patient not taking: Reported on 05/31/2016 12/08/15   Ivery QualeBryant, Hobson, PA-C   No Known Allergies  FAMILY HISTORY:  family history includes Hypertension in his brother, father, mother, and other. SOCIAL HISTORY:  reports that he has been smoking Cigarettes.  He has  been smoking about 0.50 packs per day. His smokeless tobacco use includes Chew. He reports that he drinks alcohol. He reports that he does not use drugs.  REVIEW OF SYSTEMS:   All negative; except for those that are bolded, which indicate positives.  Constitutional: weight loss, weight gain, night sweats, fevers, chills, fatigue, weakness.  HEENT: headaches, sore throat, sneezing, nasal congestion, post nasal drip, difficulty swallowing, tooth/dental pain, visual complaints, visual changes, ear aches. Neuro: difficulty with speech, weakness, numbness, ataxia. CV:  chest pain, orthopnea, PND, swelling in lower extremities, dizziness, palpitations, syncope.  Resp: cough, hemoptysis, dyspnea, wheezing. GI: heartburn, indigestion, abdominal pain, nausea, vomiting, diarrhea, constipation, change in bowel habits, loss of appetite, hematemesis, melena, hematochezia.  GU: dysuria, change in color of urine, urgency or frequency, flank pain, hematuria. MSK: joint pain or swelling, decreased range of motion. Psych: change in mood or affect, depression, anxiety, suicidal ideations, homicidal ideations. Skin: rash, itching, bruising.  SUBJECTIVE:  Reports continued dental pain. Painful to swallow. Remains on room air.   VITAL SIGNS: Temp:  [98.6 F (37 C)] 98.6 F (37 C) (05/09 0233) Pulse Rate:  [76-100] 76 (05/09 0715) Resp:  [18] 18 (05/09 0233) BP: (131-170)/(85-99) 131/85 (05/09 0715) SpO2:  [95 %-98 %] 95 % (05/09 0715) Weight:  [97.5 kg (215 lb)] 97.5 kg (215 lb) (05/09 0234)  PHYSICAL EXAMINATION: General:  Adult male, no distress  Neuro:  Alert, oriented, grossly intact  HEENT:  Left sided facial swelling Cardiovascular:  RRR, no MRG, NI S1/S2 Lungs:  Clear breath sounds, no distress  Abdomen:  Obese, non-tender, active bowel sounds  Musculoskeletal:  No acute  Skin:  Warm, dry, intact    Recent Labs Lab 05/31/16 0353  NA 139  K 3.8  CL 99*  BUN 9  CREATININE 1.10  GLUCOSE  101*    Recent Labs Lab 05/31/16 0347 05/31/16 0353  HGB 16.2 16.0  HCT 46.6 47.0  WBC 12.4*  --   PLT 201  --    Ct Soft Tissue Neck W Contrast  Result Date: 05/31/2016 CLINICAL DATA:  26 y/o M; left lower teeth pain and left neck pain. Trismus. EXAM: CT NECK WITH CONTRAST TECHNIQUE: Multidetector CT imaging of the neck was performed using the standard protocol following the bolus administration of intravenous contrast. CONTRAST:  75mL ISOVUE-300 IOPAMIDOL (ISOVUE-300) INJECTION 61% COMPARISON:  None. FINDINGS: Pharynx and larynx: The oropharynx is mildly displaced rightward due to mass effect from inflammation in the left neck. Salivary glands: Mild enhancement of the left submandibular gland probably represents reactive inflammation. Thyroid: Normal. Lymph nodes: Left submandibular and upper cervical lymphadenopathy is likely reactive. Vascular: Negative. Limited intracranial: Negative. Visualized orbits: Negative. Mastoids and visualized paranasal sinuses: Right maxillary sinus mucous retention cyst. Skeleton: Periapical cysts of the left posterior most mandibular molar with a defect in the inner table cortex compatible with odontogenic disease (series 3, image 30). Upper chest: Negative. Other: There is edema within the left masticator space, left parapharyngeal space, left sublingual space, left submandibular space, extending into left anterior subcutaneous fat. IMPRESSION: 1. Left posterior mandibular molar periapical lucency with defect of the inner table cortex compatible with odontogenic disease. This is the probable source of infection. 2. Extensive inflammation throughout the left upper neck involving the left masticator space, parapharyngeal space, sublingual space, submandibular space, and subcutaneous fat of the left anterior neck. No rim enhancing abscess is identified at this time. 3. Left submandibular and upper cervical lymphadenopathy as well as mild enhancement of left submandibular  gland is likely reactive. These results were called by telephone at the time of interpretation on 05/31/2016 at 5:33 am to Dr. Derwood Kaplan , who verbally acknowledged these results. Electronically Signed   By: Mitzi Hansen M.D.   On: 05/31/2016 05:36    ASSESSMENT / PLAN:  Left lower molar erosion with left sided facial swelling  Patient protecting airway, strong cough Plan  -Continue clindamycin  -May need ENT consult -Dental consult pending  -Okay for admission with [facility] > no ICU needs at this time   Jovita Kussmaul, AGAC-NP [facility]  Pgr: [phone]  PCCM Pgr: [phone]   ATTENDING NOTE / ATTESTATION NOTE :   I have discussed the case with the resident/APP  Jovita Kussmaul NP.   I agree with the resident/APP's  history, physical examination, assessment, and plans.    I have edited the above note and modified it according to our agreed history, physical examination, assessment and plan.   26 year old male with PMH of HTN. Presents to ED on 5/9 with reported 2 days dental pain/left-sided facial swelling. Patient reports he does not have a dentist and has not been for follow up in a while (unsure time frame). Reports that pain has gradually worsened over the left few days and now reports that he is unable to eat or drink due to pain. Denies difficulty breathing. Upon examination left lower molar has erosion and edema. PCCM was asked to consult regarding airway protection.   Complains of L neck and L lower jaw pain  and some difficulty opening mouth. Same as when he got to ED. Not worse.   Vitals:  Vitals:   05/31/16 0830 05/31/16 0845 05/31/16 0900 05/31/16 0939  BP:  128/76 124/78 (!) 149/96  Pulse: 80 80 82 85  Resp:    16  Temp:    98.2 F (36.8 C)  TempSrc:    Oral  SpO2: 96% 96% 95% 97%  Weight:    102.7 kg (226 lb 8 oz)  Height:    6\' 3"  (1.905 m)    Constitutional/General: well-nourished, well-developed, comfortable,  speaking, NAD, protecting airway.  Body mass index is 28.31 kg/m. Wt Readings from Last 3 Encounters:  05/31/16 102.7 kg (226 lb 8 oz)  12/07/15 76.7 kg (169 lb)  10/14/10 120.2 kg (265 lb) (>99 %, Z= 2.63)*   * Growth percentiles are based on CDC 2-20 Years data.    HEENT: PERLA, anicteric sclerae. (-) Oral thrush.   Neck: No masses. Midline trachea. No JVD, tender L neck and L lower jaw.  Some erythema. (-) fluctuant area in neck  palpated. Some difficulty opening mouth.   Respiratory/Chest: Grossly normal chest. (-) deformity. (-) Accessory muscle use.  Symmetric expansion. Diminished BS on both lower lung zones. (-) wheezing, crackles, rhonchi (-) egophony  Cardiovascular: Regular rate and  rhythm, heart sounds normal, no murmur or gallops,  Trace peripheral edema  Gastrointestinal:  Normal bowel sounds. Soft, non-tender. No hepatosplenomegaly.  (-) masses.   Musculoskeletal:  Normal muscle tone.   Extremities: Grossly normal. (-) clubbing, cyanosis.  (-) edema  Skin: (-) rash,lesions seen.   Neurological/Psychiatric : sedated, intubated. CN grossly intact. (-) lateralizing signs.     CBC Recent Labs     05/31/16  0347  05/31/16  0353  WBC  12.4*   --   HGB  16.2  16.0  HCT  46.6  47.0  PLT  201   --     Coag's No results for input(s): APTT, INR in the last 72 hours.  BMET Recent Labs     05/31/16  0353  NA  139  K  3.8  CL  99*  BUN  9  CREATININE  1.10  GLUCOSE  101*    Electrolytes No results for input(s): CALCIUM, MG, PHOS in the last 72 hours.  Sepsis Markers No results for input(s): PROCALCITON, O2SATVEN in the last 72 hours.  Invalid input(s): LACTICACIDVEN  ABG No results for input(s): PHART, PCO2ART, PO2ART in the last 72 hours.  Liver Enzymes No results for input(s): AST, ALT, ALKPHOS, BILITOT, ALBUMIN in the last 72 hours.  Cardiac Enzymes No results for input(s): TROPONINI, PROBNP in the last 72 hours.  Glucose No  results for input(s): GLUCAP in the last 72 hours.  Imaging Ct Soft Tissue Neck W Contrast  Result Date: 05/31/2016 CLINICAL DATA:  26 y/o M; left lower teeth pain and left neck pain. Trismus. EXAM: CT NECK WITH CONTRAST TECHNIQUE: Multidetector CT imaging of the neck was performed using the standard protocol following the bolus administration of intravenous contrast. CONTRAST:  75mL ISOVUE-300 IOPAMIDOL (ISOVUE-300) INJECTION 61% COMPARISON:  None. FINDINGS: Pharynx and larynx: The oropharynx is mildly displaced rightward due to mass effect from inflammation in the left neck. Salivary glands: Mild enhancement of the left submandibular gland probably represents reactive inflammation. Thyroid: Normal. Lymph nodes: Left submandibular and upper cervical lymphadenopathy is likely reactive. Vascular: Negative. Limited intracranial: Negative. Visualized orbits: Negative. Mastoids and visualized paranasal sinuses: Right maxillary sinus mucous retention cyst.  Skeleton: Periapical cysts of the left posterior most mandibular molar with a defect in the inner table cortex compatible with odontogenic disease (series 3, image 30). Upper chest: Negative. Other: There is edema within the left masticator space, left parapharyngeal space, left sublingual space, left submandibular space, extending into left anterior subcutaneous fat. IMPRESSION: 1. Left posterior mandibular molar periapical lucency with defect of the inner table cortex compatible with odontogenic disease. This is the probable source of infection. 2. Extensive inflammation throughout the left upper neck involving the left masticator space, parapharyngeal space, sublingual space, submandibular space, and subcutaneous fat of the left anterior neck. No rim enhancing abscess is identified at this time. 3. Left submandibular and upper cervical lymphadenopathy as well as mild enhancement of left submandibular gland is likely reactive. These results were called by  telephone at the time of interpretation on 05/31/2016 at 5:33 am to Dr. Derwood Kaplan , who verbally acknowledged these results. Electronically Signed   By: Mitzi Hansen M.D.   On: 05/31/2016 05:36    Assessment/Plan : L Odontogenic Inflammation + Swelling + Infection. No obvious abscess per CT scan.  - he is protecting his airway.  - cont IV abx with clindamycin + unasyn. Switch to PO augmentin when able - will order blood cultures - NPO until he is able to take PO. Cont IVF - agree with SDU at least for next 24 hrs. If better, may transfer out of SDU in am - if he does not get better in 24 hrs, suggest ENT evaluation, possible re-imaging.    PCCM will sign off for now.  Pt being admitted under Medicine Service.  Call back if with issues.   Family :Family updated at length today.  Pt and wife updated at bedside.    Pollie Meyer, MD 05/31/2016, 10:59 AM [facility] Pager [phone] After 3 pm or if no answer, call [phone]

## 2016-05-31 NOTE — Care Management Note (Signed)
Case Management Note  Patient Details  Name: Marc Hebert MRN: [id number] Date of Birth: [birth year]  Subjective/Objective:  From home , presents with odontogenic infection with trismus,  conts on decadron iv, d5 ivf, dilaudid iv, toradol iv.            Action/Plan: NCM will follow for dc needs.  Expected Discharge Date:                  Expected Discharge Plan:  Home/Self Care  In-House Referral:     Discharge planning Services  CM Consult  Post Acute Care Choice:    Choice offered to:     DME Arranged:    DME Agency:     HH Arranged:    HH Agency:     Status of Service:  In process, will continue to follow  If discussed at Long Length of Stay Meetings, dates discussed:    Additional Comments:  Leone Havenaylor, Marlinda Miranda Clinton, RN 05/31/2016, 5:03 PM

## 2016-05-31 NOTE — ED Notes (Signed)
Pt able to swallow clear liquids without difficulty.  Unable to chew d/t pain.

## 2016-06-01 DIAGNOSIS — R229 Localized swelling, mass and lump, unspecified: Secondary | ICD-10-CM

## 2016-06-01 LAB — BASIC METABOLIC PANEL
Anion gap: 9 (ref 5–15)
BUN: 15 mg/dL (ref 6–20)
CALCIUM: 9.2 mg/dL (ref 8.9–10.3)
CO2: 26 mmol/L (ref 22–32)
CREATININE: 0.89 mg/dL (ref 0.61–1.24)
Chloride: 101 mmol/L (ref 101–111)
GFR calc Af Amer: >60 mL/min (ref >60)
GFR calc non Af Amer: >60 mL/min (ref >60)
GLUCOSE: 145 mg/dL — AB (ref 65–99)
POTASSIUM: 4.6 mmol/L (ref 3.5–5.1)
Sodium: 136 mmol/L (ref 135–145)

## 2016-06-01 LAB — CBC
HCT: 43.4 % (ref 39.0–52.0)
Hemoglobin: 15 g/dL (ref 13.0–17.0)
MCH: 30.3 pg (ref 26.0–34.0)
MCHC: 34.6 g/dL (ref 30.0–36.0)
MCV: 87.7 fL (ref 78.0–100.0)
Platelets: 213 10*3/uL (ref 150–400)
RBC: 4.95 MIL/uL (ref 4.22–5.81)
RDW: 12.5 % (ref 11.5–15.5)
WBC: 15.8 10*3/uL — AB (ref 4.0–10.5)

## 2016-06-01 LAB — HIV ANTIBODY (ROUTINE TESTING W REFLEX): HIV SCREEN 4TH GENERATION: NONREACTIVE

## 2016-06-01 MED ORDER — DEXAMETHASONE SODIUM PHOSPHATE 4 MG/ML IJ SOLN
4.0000 mg | Freq: Four times a day (QID) | INTRAMUSCULAR | Status: AC
  Administered 2016-06-01 – 2016-06-02 (×5): 4 mg via INTRAVENOUS
  Filled 2016-06-01 (×5): qty 1

## 2016-06-01 MED ORDER — KCL IN DEXTROSE-NACL 20-5-0.45 MEQ/L-%-% IV SOLN
INTRAVENOUS | Status: DC
Start: 2016-06-01 — End: 2016-06-02
  Administered 2016-06-01 – 2016-06-02 (×3): via INTRAVENOUS
  Filled 2016-06-01 (×2): qty 1000

## 2016-06-01 NOTE — Progress Notes (Signed)
   Subjective:  Patient states he is still having pain, but endorses some improvement in swelling and pain with swallowing. He still is unable to open him mouth very much and unable to eat solids due to pain.  Objective:  Vital signs in last 24 hours: Vitals:   05/31/16 2007 05/31/16 2328 06/01/16 0400 06/01/16 0812  BP: 103/66 113/70 107/68 110/64  Pulse: 67 72 70 67  Resp: 16 12 15 16   Temp: 97.8 F (36.6 C) 97.7 F (36.5 C) 97.6 F (36.4 C) 97.6 F (36.4 C)  TempSrc: Oral Oral Oral Oral  SpO2: 95% 98% 98% 99%  Weight:      Height:       Constitutional: NAD HEENT: trismus, able to open mouth ~2.5cm; left neck swelling is still tender but reduced in size, able to clear secretions, strong cough CV: RRR, no murmurs, rubs or gallops Resp: CTAB  Assessment/Plan:  Active Problems:   Dental infection  Odontogenic Infection with Trismus: Patient remains afebrile; he has had some improvement in pain and swelling, though trismus still unchanged. He is able to adequately protect his airway.  --transfer out of SDU --continue Unasyn 3g q6hrs --continue decadron 4mg  q6hrs --D5 1/2NS +20 KCl  --Toradol 30mg  q6hr PRN for severe pain, Dilaudid 0.5mg  q4hr PRN for breakthrough pain  Dispo: Anticipated discharge in approximately 1-2 day(s).   Nyra MarketSvalina, Aicha Clingenpeel, MD 06/01/2016, 12:43 PM Pager [phone]

## 2016-06-02 ENCOUNTER — Encounter (HOSPITAL_COMMUNITY): Payer: Self-pay | Admitting: *Deleted

## 2016-06-02 DIAGNOSIS — M7989 Other specified soft tissue disorders: Secondary | ICD-10-CM

## 2016-06-02 LAB — BASIC METABOLIC PANEL
Anion gap: 8 (ref 5–15)
BUN: 13 mg/dL (ref 6–20)
CALCIUM: 8.8 mg/dL — AB (ref 8.9–10.3)
CO2: 25 mmol/L (ref 22–32)
CREATININE: 0.82 mg/dL (ref 0.61–1.24)
Chloride: 105 mmol/L (ref 101–111)
GFR calc Af Amer: >60 mL/min (ref >60)
GFR calc non Af Amer: >60 mL/min (ref >60)
GLUCOSE: 143 mg/dL — AB (ref 65–99)
Potassium: 4.7 mmol/L (ref 3.5–5.1)
SODIUM: 138 mmol/L (ref 135–145)

## 2016-06-02 LAB — CBC
HCT: 39.9 % (ref 39.0–52.0)
Hemoglobin: 13.4 g/dL (ref 13.0–17.0)
MCH: 30.1 pg (ref 26.0–34.0)
MCHC: 33.6 g/dL (ref 30.0–36.0)
MCV: 89.7 fL (ref 78.0–100.0)
PLATELETS: 204 10*3/uL (ref 150–400)
RBC: 4.45 MIL/uL (ref 4.22–5.81)
RDW: 12.7 % (ref 11.5–15.5)
WBC: 17.3 10*3/uL — AB (ref 4.0–10.5)

## 2016-06-02 MED ORDER — AMOXICILLIN-POT CLAVULANATE 500-125 MG PO TABS: 1.0000 | ORAL_TABLET | Freq: Two times a day (BID) | ORAL | 0 refills | Status: AC

## 2016-06-02 MED ORDER — IBUPROFEN 800 MG PO TABS: 800.0000 mg | ORAL_TABLET | Freq: Three times a day (TID) | ORAL | 0 refills | Status: DC | PRN

## 2016-06-02 MED ORDER — SENNOSIDES-DOCUSATE SODIUM 8.6-50 MG PO TABS: 1.0000 | ORAL_TABLET | Freq: Every day | ORAL | 0 refills | Status: DC

## 2016-06-02 MED ORDER — AMOXICILLIN-POT CLAVULANATE 500-125 MG PO TABS
500.0000 mg | ORAL_TABLET | Freq: Two times a day (BID) | ORAL | Status: DC
  Administered 2016-06-02: 500 mg via ORAL
  Filled 2016-06-02 (×2): qty 1

## 2016-06-02 MED ORDER — OXYCODONE-ACETAMINOPHEN 10-325 MG PO TABS: 0.5000 | ORAL_TABLET | Freq: Three times a day (TID) | ORAL | 0 refills | Status: DC | PRN

## 2016-06-02 MED FILL — AMOX-CLAV 500-125 MG TABLET: 500-125 | 4 days supply | Qty: 9 | Fill #0

## 2016-06-02 NOTE — Progress Notes (Addendum)
   Subjective:  Patient states pain and swelling is overall improving. He was able to eat some soft food last night.   Objective:  Vital signs in last 24 hours: Vitals:   06/01/16 1530 06/01/16 1612 06/01/16 2049 06/02/16 0602  BP: 139/65 135/73 125/70 132/79  Pulse: 98 75 81 73  Resp: (!) 21 18 19 18   Temp: 97.8 F (36.6 C) 97.4 F (36.3 C) 98.4 F (36.9 C) 97.8 F (36.6 C)  TempSrc: Oral Oral Oral Oral  SpO2: 98% 98% 94% 99%  Weight:      Height:       Constitutional: NAD HEENT: trismus, able to open mouth better today; left neck swelling is still tender but significantly reduced in size CV: RRR, no murmurs, rubs or gallops Resp: CTAB  Assessment/Plan:  Active Problems:   Dental infection   Soft tissue swelling  Odontogenic Infection with Trismus: Patient continues to improve in terms of pain, swelling, ability to take oral intake and amount of trismus. --transition to Augmentin 500mg  BID --continue decadron 4mg  q6hrs - stop at discharge --oxycodone apap 10-325mg  TID PRN, ibuprofen 800mg   Dispo: Anticipated discharge today; Attempted contacting Dr. Leanord AsalFarless who is already aware of patient about f/u appointment and left message. I will give the patient contact info and urge to call on Monday morning when office is open. He was given info for free clinic in [place] follow [place].   Nyra MarketSvalina, Pasqualina Colasurdo, MD 06/02/2016, 11:58 AM Pager [phone]

## 2016-06-02 NOTE — Discharge Summary (Signed)
Name: Marc Hebert MRN: [id number] DOB: 1990/12/26 26 y.o. PCP: Patient, No Pcp Per  Date of Admission: 05/31/2016  2:42 AM Date of Discharge: 06/02/2016 Attending Physician: Tyson Aliasuncan Thomas Vincent, MD  Discharge Diagnosis: 1. Odontogenic infection with trismus Active Problems:   Dental infection   Soft tissue swelling   Discharge Medications: Allergies as of 06/02/2016   No Known Allergies     Medication List    TAKE these medications   amoxicillin-clavulanate 500-125 MG tablet Commonly known as:  AUGMENTIN Take 1 tablet (500 mg total) by mouth 2 (two) times daily.   ibuprofen 800 MG tablet Commonly known as:  ADVIL,MOTRIN Take 1 tablet (800 mg total) by mouth every 8 (eight) hours as needed.   oxyCODONE-acetaminophen 10-325 MG tablet Commonly known as:  PERCOCET Take 0.5-1 tablets by mouth every 8 (eight) hours as needed for pain.   senna-docusate 8.6-50 MG tablet Commonly known as:  Senokot-S Take 1 tablet by mouth at bedtime.       Disposition and follow-up:   Marc Hebert was [facility] in Stable condition.  At the hospital follow up visit please address:  1.   Odontogenic infection with trismus: --has patient followed up with dentist? --has he had change in swelling? Trouble swallowing?  2.  Labs / imaging needed at time of follow-up: none  3.  Pending labs/ test needing follow-up: blood cultures 05/31/16, NGTD  Follow-up Appointments: Follow-up Information    Everardo AllFarless, Graham, DDS. Schedule an appointment as soon as possible for a visit.   Specialty:  Dentistry Why:  Call on Monday morning! Contact information: [address] [place] [id number] [phone]        [facility] Schedule an appointment as soon as possible for a visit.   Contact information: [address] [place] [id number] [phone]          Hospital Course by problem  list: Active Problems:   Dental infection   Soft tissue swelling   Odontogenic Infection with Trismus Patient presented to ED with 2-3 days of progressive left sided tooth pain with no prior history of immunosuppression or similar occurrences. Patient was afebrile, without leukocytosis. He was evaluated in the ED by Critical Care to evaluate his airway; he was handling his secretions well and had a strong cough. Dentistry was also consulted and they agreed with plan to admit with IV antibiotics and follow up outpatient when trismus is resolved. He was monitored in step down unit and given IV antibiotics and decadron for 2 days and pain was addressed. His pain and swelling improved significantly as well as his trismus. At discharge, patient was able to eat soft foods which was a great improvement, and able to adequately open his mouth for proper dental evaluation. He was discharged with 3 more days of antibiotics, Augmentin, as well as 3 day course of ibuprofen and oxycodone for pain control. I attempted scheduling dental appointment prior to discharge but was unable to reach office or dentist. The dentist on call was already aware of patient and message was left with his office. Patient was provided with contact information for the dentist and instructed to call Monday morning to set up appointment.   Discharge Vitals:   BP 131/72 (BP Location: Right Arm)   Pulse 73   Temp 97.8 F (36.6 C) (Oral)   Resp 18   Ht 6\' 3"  (1.905 m)   Wt 102.7 kg (226  lb 8 oz)   SpO2 100%   BMI 28.31 kg/m   Pertinent Labs, Studies, and Procedures:  CBC Latest Ref Rng & Units 06/02/2016 06/01/2016 05/31/2016  WBC 4.0 - 10.5 K/uL 17.3(H) 15.8(H) -  Hemoglobin 13.0 - 17.0 g/dL 16.1 09.6 04.5  Hematocrit 39.0 - 52.0 % 39.9 43.4 47.0  Platelets 150 - 400 K/uL 204 213 -   BMP Latest Ref Rng & Units 06/02/2016 06/01/2016 05/31/2016  Glucose 65 - 99 mg/dL 409(W) 119(J) 478(G)  BUN 6 - 20 mg/dL 13 15 9   Creatinine 0.61 - 1.24  mg/dL 9.56 2.13 0.86  Sodium 135 - 145 mmol/L 138 136 139  Potassium 3.5 - 5.1 mmol/L 4.7 4.6 3.8  Chloride 101 - 111 mmol/L 105 101 99(L)  CO2 22 - 32 mmol/L 25 26 -  Calcium 8.9 - 10.3 mg/dL 5.7(Q) 9.2 -   HIV 4/69: non reactive  Discharge Instructions: Discharge Instructions    Call MD for:  difficulty breathing, headache or visual disturbances    Complete by:  As directed    Call MD for:  extreme fatigue    Complete by:  As directed    Call MD for:  hives    Complete by:  As directed    Call MD for:  persistant dizziness or light-headedness    Complete by:  As directed    Call MD for:  persistant nausea and vomiting    Complete by:  As directed    Call MD for:  redness, tenderness, or signs of infection (pain, swelling, redness, odor or green/yellow discharge around incision site)    Complete by:  As directed    Call MD for:  severe uncontrolled pain    Complete by:  As directed    Call MD for:  temperature >100.4    Complete by:  As directed    Diet - low sodium heart healthy    Complete by:  As directed    Discharge instructions    Complete by:  As directed    Please call Dr. Leanord Asal' office first thing Monday morning to schedule an appointment as soon as possible to take care of your tooth.  Continue taking the antibiotic - Augmentin - one tab every 12 hours until you see the dentist.   For pain, you can take ibuprofen 800mg  three times a day (with a meal). This will help with the swelling and pain. If you need to, you can also take Percocet - half to one tablet every 8 hours as needed.   Increase activity slowly    Complete by:  As directed       Signed: Nyra Market, MD 06/02/2016, 5:29 PM   Pager [phone]

## 2016-06-02 NOTE — Care Management Note (Signed)
Case Management Note  Patient Details  Name: Marc Hebert MRN: [id number] Date of Birth: 11-28-1990  Subjective/Objective:                    Action/Plan:  Confirmed with patient he has no insurance. Provided and explained MATCH letter. Pain medication and over the counter medication not covered.   Per notes and patient , MD arranging follow up with Dr Leanord AsalFarless early next week.   Provided information on [facility] [place].   Patient voiced understanding to all of above.  Expected Discharge Date:                  Expected Discharge Plan:  Home/Self Care  In-House Referral:     Discharge planning Services  CM Consult, Medication Assistance, MATCH Program, Indigent Health Clinic  Post Acute Care Choice:    Choice offered to:  Patient  DME Arranged:    DME Agency:     HH Arranged:    HH Agency:     Status of Service:  Completed, signed off  If discussed at [employer] Meetings, dates discussed:    Additional Comments:  [name], Yuli Lanigan Marie, RN 06/02/2016, 11:28 AM

## 2016-06-02 NOTE — Progress Notes (Signed)
Discharge home. Home discharge instruction given, no question verbalized. 

## 2016-06-05 LAB — CULTURE, BLOOD (ROUTINE X 2)
CULTURE: NO GROWTH
Culture: NO GROWTH
SPECIAL REQUESTS: ADEQUATE
Special Requests: ADEQUATE

## 2016-07-28 IMAGING — CR DG CHEST 2V
2 series · 2 of 2 positions shown · non-contrast
Comparison: None.

CLINICAL DATA: Patient with intermittent chest pain and shortness
of breath.

EXAM:
CHEST  2 VIEW

[w chest pa]
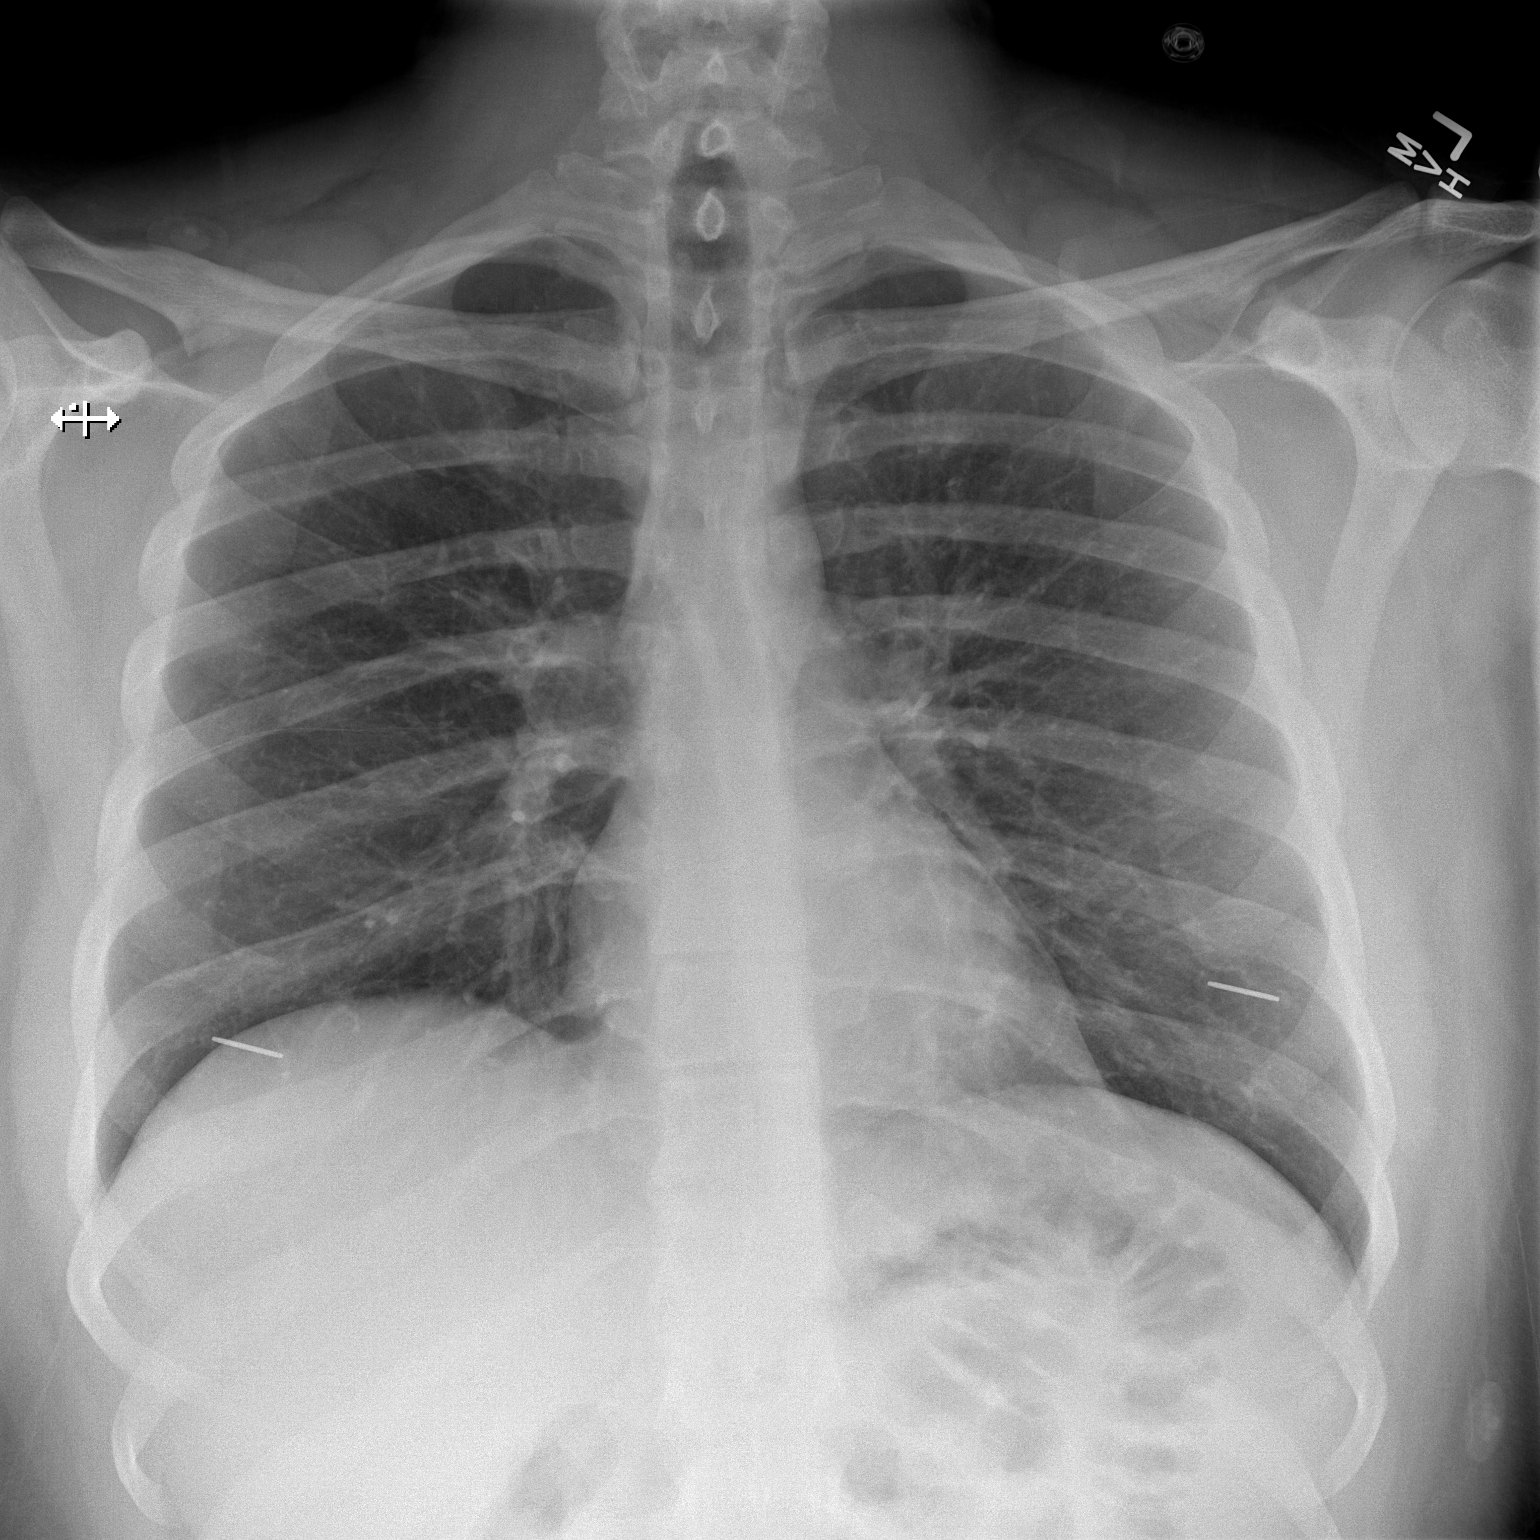

[w chest lat]
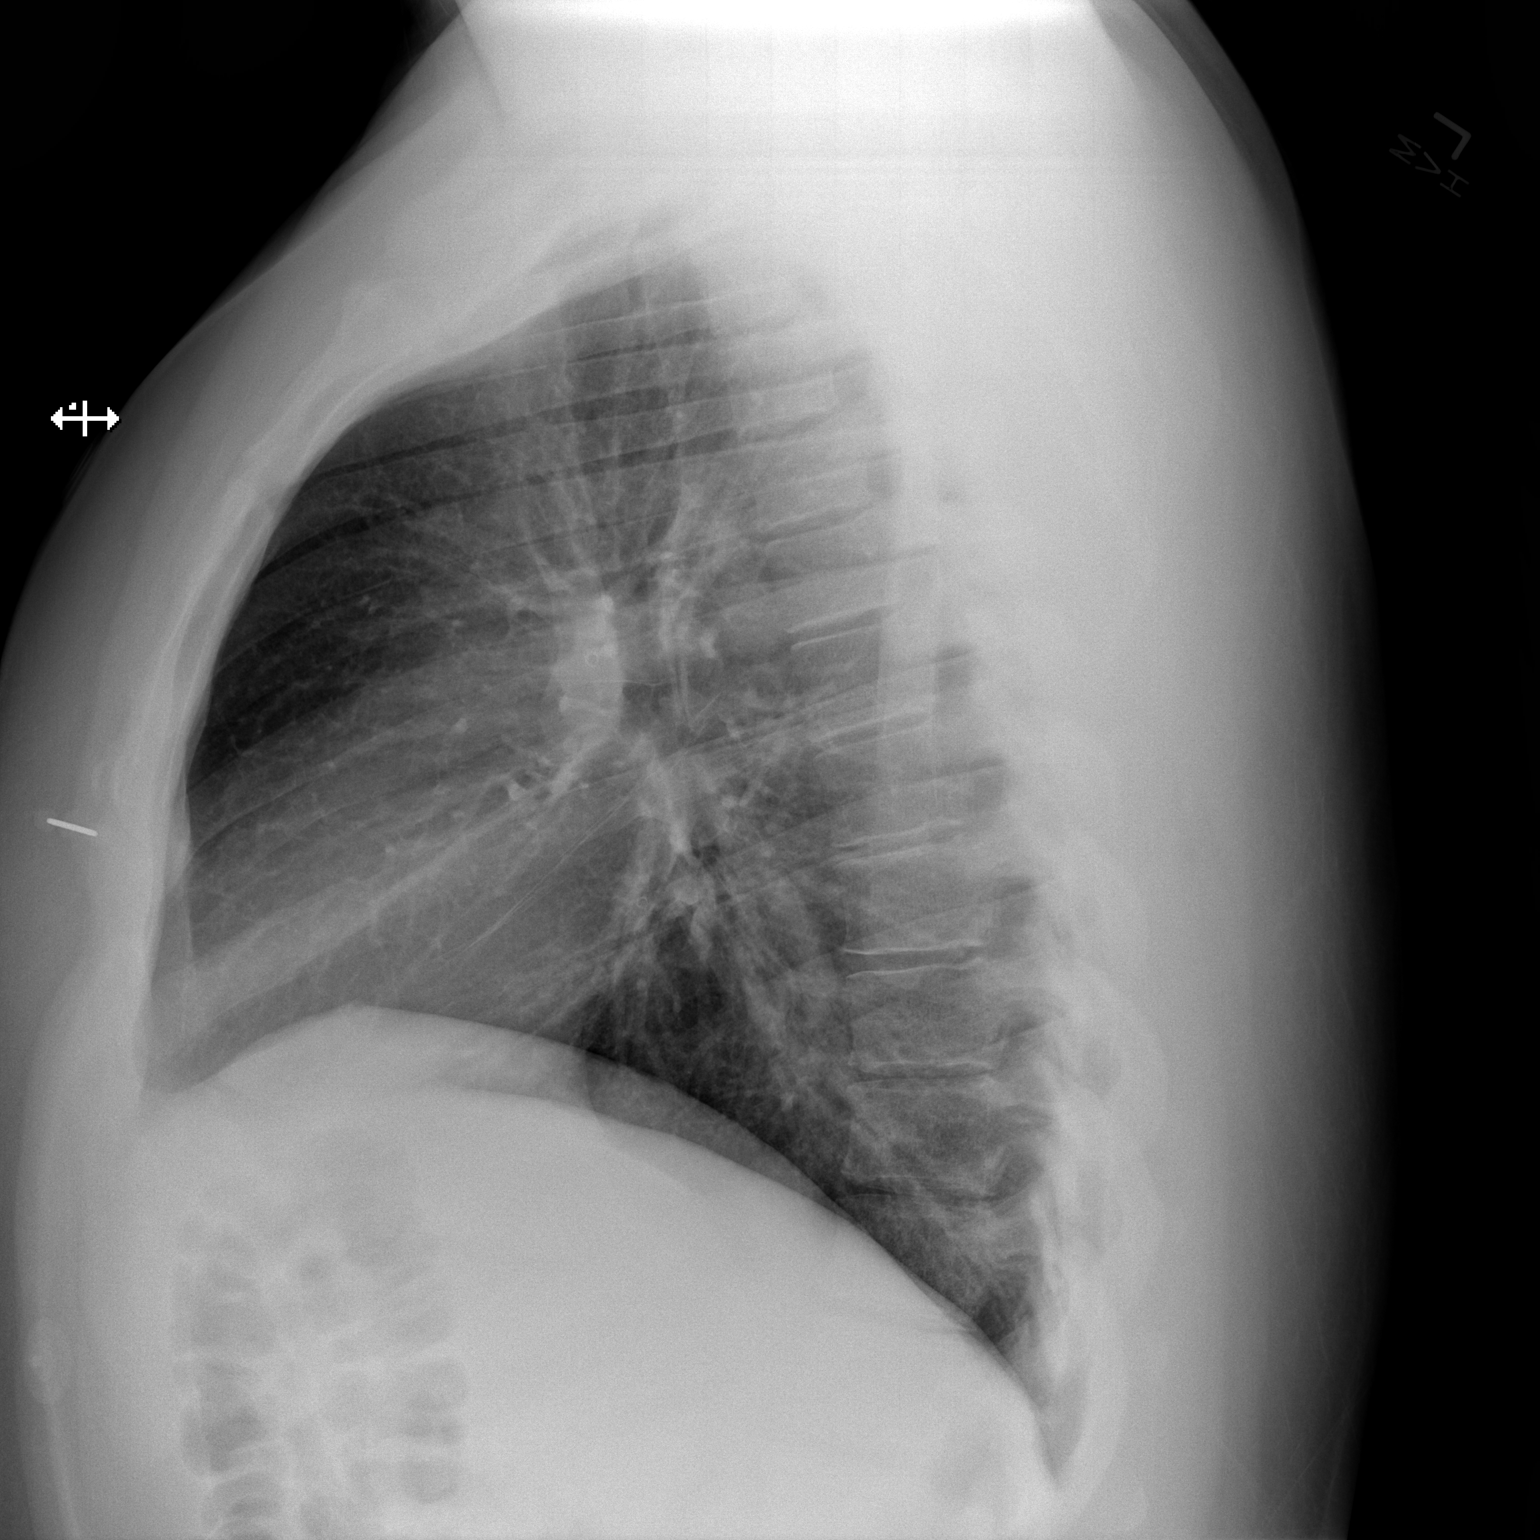

[2 of 2 positions shown; findings below may reference images not displayed]

FINDINGS: Normal cardiac and mediastinal contours. No consolidative pulmonary
opacities. No pleural effusion or pneumothorax. Regional skeleton is
unremarkable. Bilateral nipple piercing.
IMPRESSION: No acute cardiopulmonary process.

## 2018-06-01 IMAGING — CT CT NECK W/ CM
4 of 5 series · 15 of 33 positions shown, 17 images · IV contrast (iopamidol)
Comparison: None.

CLINICAL DATA: 26 y/o M; left lower teeth pain and left neck pain.
Trismus.

EXAM:
CT NECK WITH CONTRAST
TECHNIQUE: Multidetector CT imaging of the neck was performed using the
standard protocol following the bolus administration of intravenous
contrast.
CONTRAST:  75mL 4GTM6V-QKK IOPAMIDOL (4GTM6V-QKK) INJECTION 61%

[Series 3: neck 2.0 st · axial · 0.48mm/px · z∈[-246,-104]mm · 4 of 119 slices shown, 5 images (1 of 3)]
[im 24/119  soft-tissue]
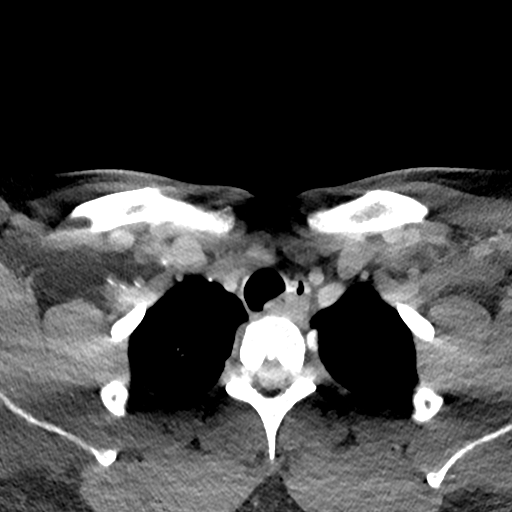
[im 24/119  bone]
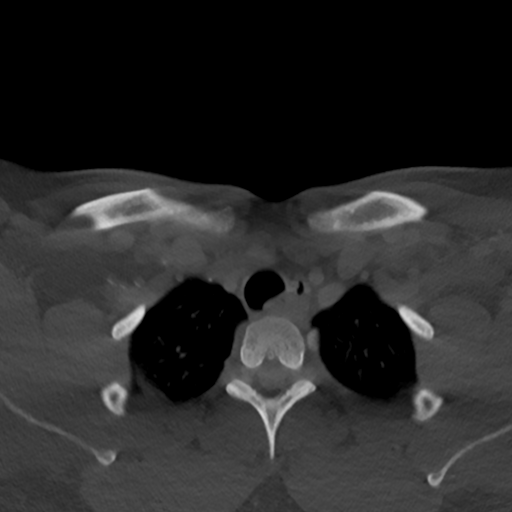
[im 48/119  bone]
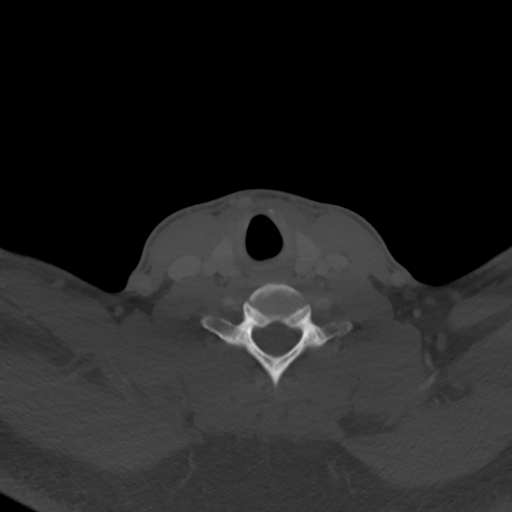
[im 71/119  bone]
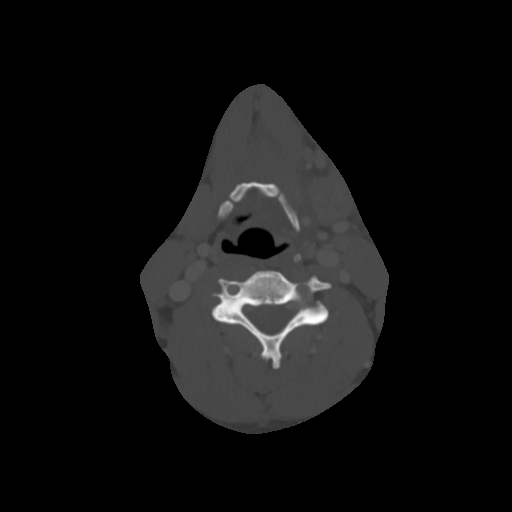
[im 95/119  bone]
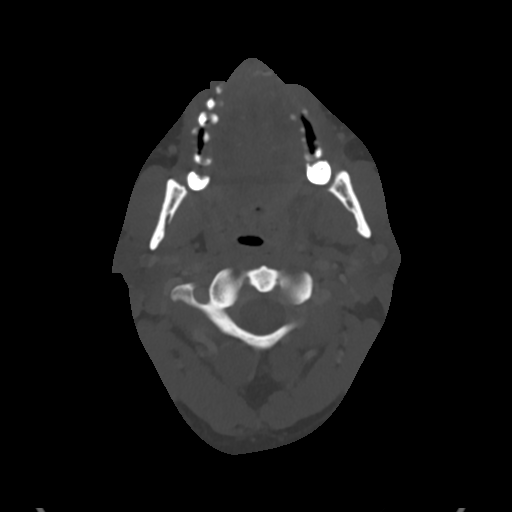

[Series 5: neck 2.0 st · sagittal · 0.46mm/px · 5 of 101 slices shown, 6 images (2 of 3)]
[im 34/101  bone]
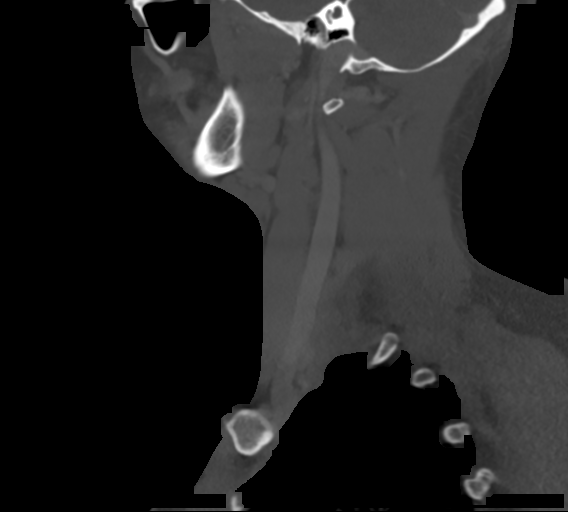
[im 42/101  bone]
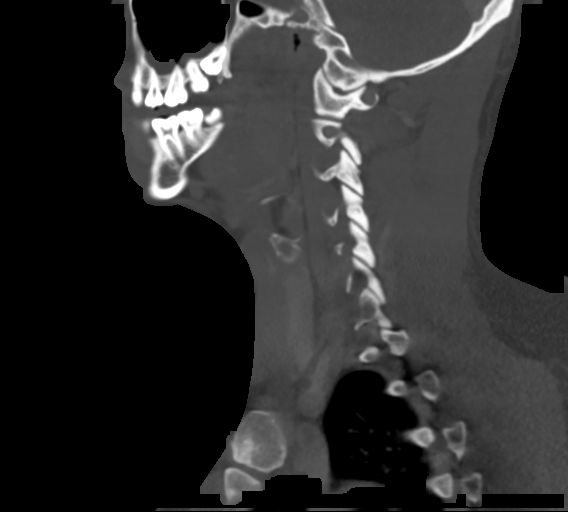
[im 51/101  soft-tissue]
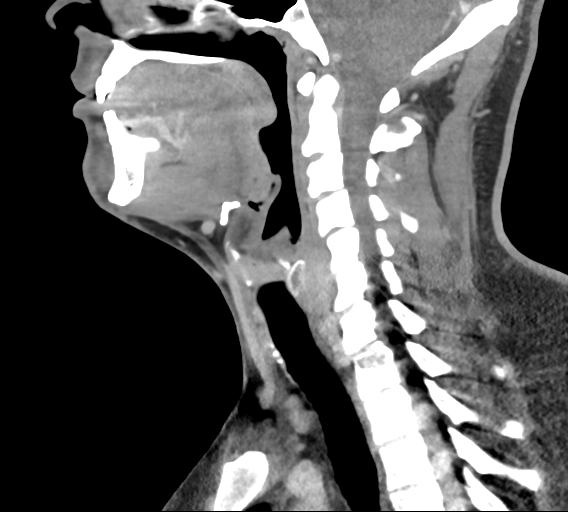
[im 51/101  bone]
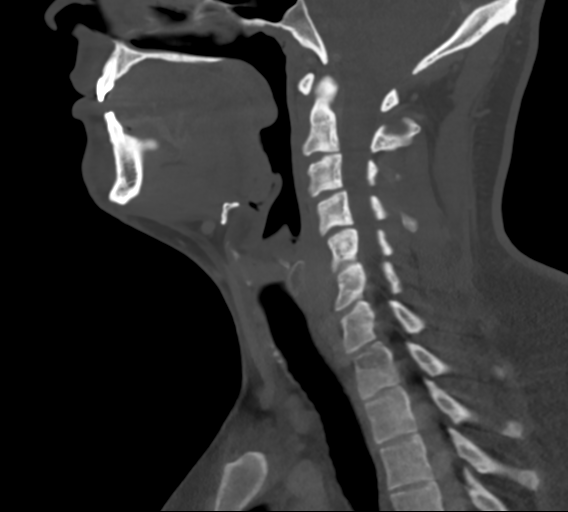
[im 59/101  bone]
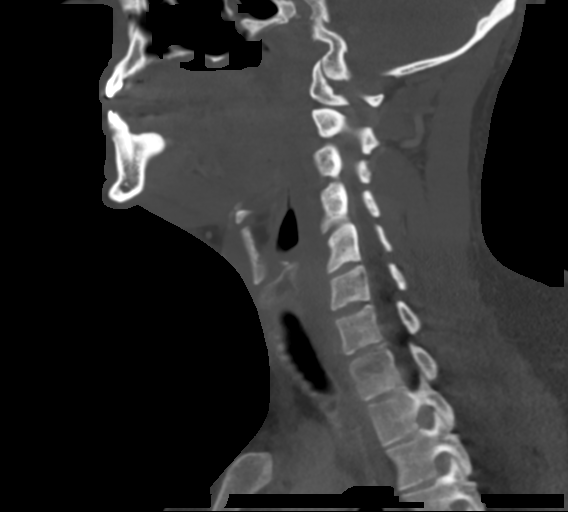
[im 67/101  bone]
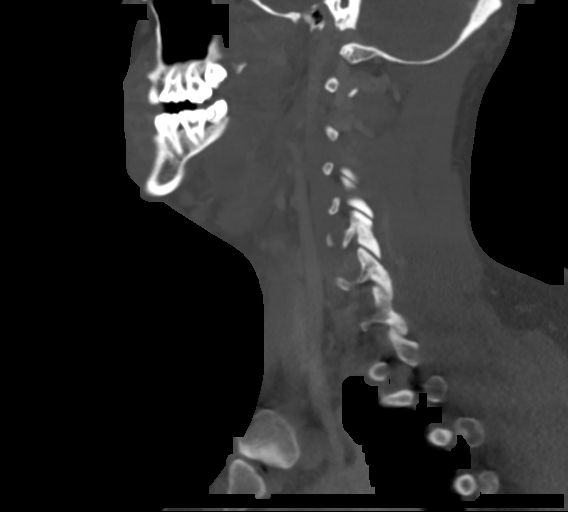

[Series 6: neck 2.0 st · coronal · 0.46mm/px · 3 of 120 slices shown (3 of 3)]
[im 24/120  bone]
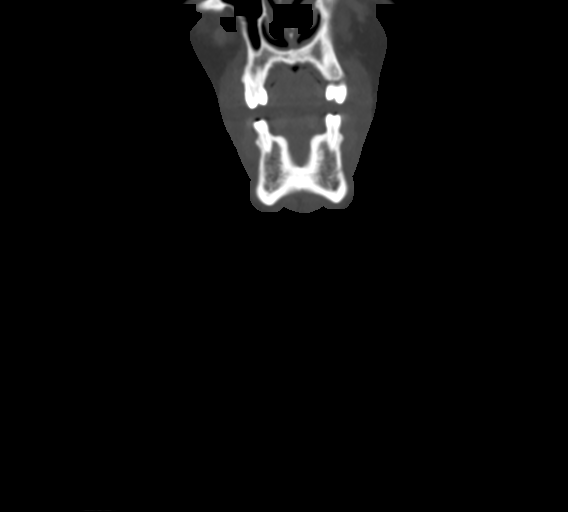
[im 48/120  bone]
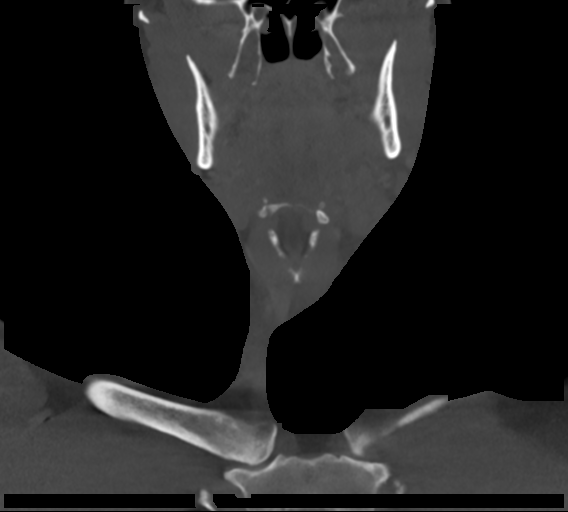
[im 72/120  bone]
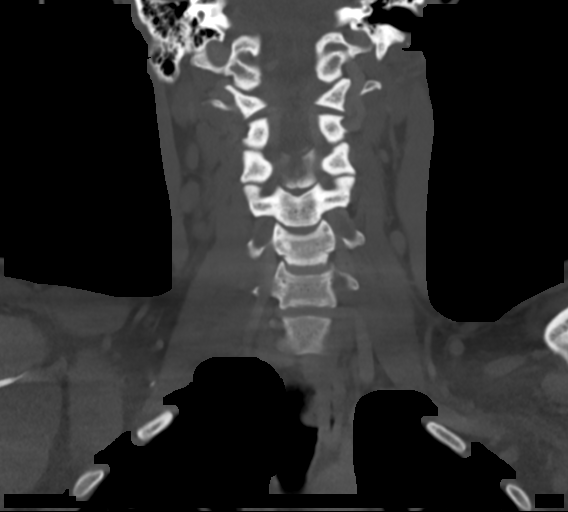

[Series 7: neck 2.0 st orthogonal · axial · 0.39mm/px · z∈[-262,-169]mm · 3 of 118 slices shown]
[im 24/118  bone]
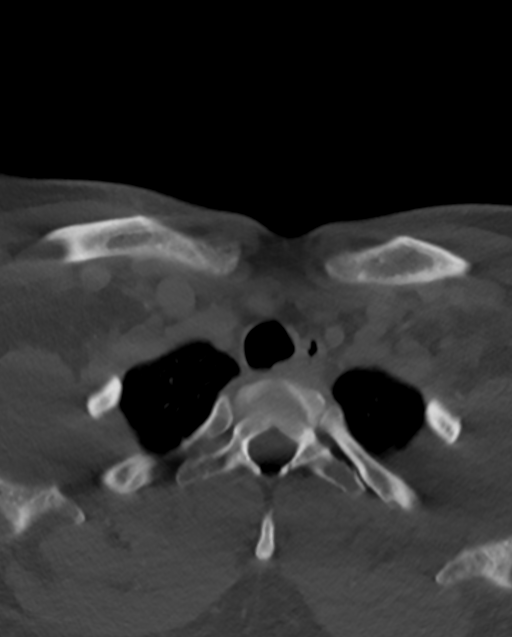
[im 47/118  bone]
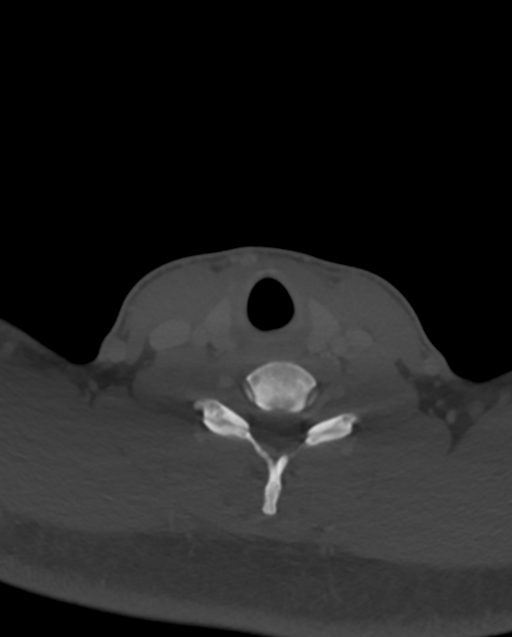
[im 71/118  bone]
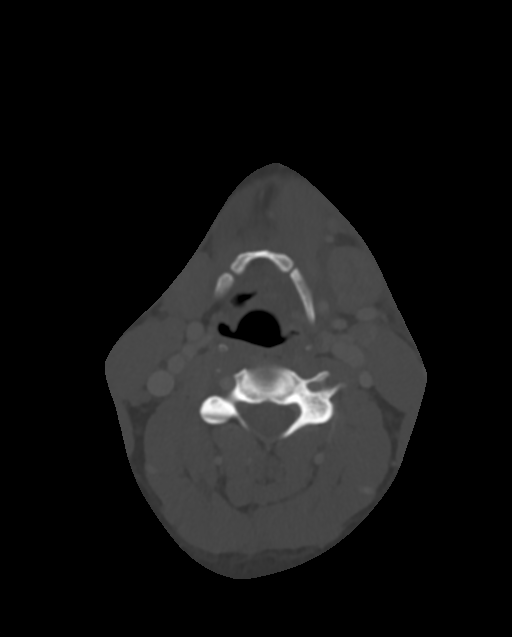

[15 of 33 positions shown; findings below may reference images not displayed]

FINDINGS: Pharynx and larynx: The oropharynx is mildly displaced rightward due
to mass effect from inflammation in the left neck.

Salivary glands: Mild enhancement of the left submandibular gland
probably represents reactive inflammation.

Thyroid: Normal.

Lymph nodes: Left submandibular and upper cervical lymphadenopathy
is likely reactive.

Vascular: Negative.

Limited intracranial: Negative.

Visualized orbits: Negative.

Mastoids and visualized paranasal sinuses: Right maxillary sinus
mucous retention cyst.

Skeleton: Periapical cysts of the left posterior most mandibular
molar with a defect in the inner table cortex compatible with
odontogenic disease (series 3, image 30).

Upper chest: Negative.

Other: There is edema within the left masticator space, left
parapharyngeal space, left sublingual space, left submandibular
space, extending into left anterior subcutaneous fat.
IMPRESSION: 1. Left posterior mandibular molar periapical lucency with defect of
the inner table cortex compatible with odontogenic disease. This is
the probable source of infection.
2. Extensive inflammation throughout the left upper neck involving
the left masticator space, parapharyngeal space, sublingual space,
submandibular space, and subcutaneous fat of the left anterior neck.
No rim enhancing abscess is identified at this time.
3. Left submandibular and upper cervical lymphadenopathy as well as
mild enhancement of left submandibular gland is likely reactive.
These results were called by telephone at the time of interpretation
on 05/31/2016 at [DATE] to Dr. AMADO HAIRSTON , who verbally
acknowledged these results.

By: Mell Ferrufino M.D.

## 2019-04-10 ENCOUNTER — Emergency Department (HOSPITAL_COMMUNITY): Payer: Worker's Compensation | Admitting: Anesthesiology

## 2019-04-10 ENCOUNTER — Other Ambulatory Visit: Payer: Self-pay

## 2019-04-10 ENCOUNTER — Encounter (HOSPITAL_COMMUNITY)
Admission: EM | Disposition: A | Discharge status: Home / Self Care | Payer: Self-pay | Source: Home / Self Care | Attending: Emergency Medicine

## 2019-04-10 ENCOUNTER — Ambulatory Visit (HOSPITAL_COMMUNITY): Payer: Worker's Compensation

## 2019-04-10 ENCOUNTER — Encounter (HOSPITAL_COMMUNITY): Payer: Self-pay | Admitting: Orthopedic Surgery

## 2019-04-10 ENCOUNTER — Emergency Department (HOSPITAL_COMMUNITY): Payer: Worker's Compensation

## 2019-04-10 ENCOUNTER — Ambulatory Visit (HOSPITAL_COMMUNITY)
Admission: EM | Admit: 2019-04-10 | Discharge: 2019-04-10 | Disposition: A | Discharge status: Home / Self Care | Payer: Worker's Compensation | Attending: Emergency Medicine | Admitting: Emergency Medicine

## 2019-04-10 DIAGNOSIS — Y990 Civilian activity done for income or pay: Secondary | ICD-10-CM | POA: Diagnosis not present

## 2019-04-10 DIAGNOSIS — S64494A Injury of digital nerve of right ring finger, initial encounter: Secondary | ICD-10-CM | POA: Diagnosis not present

## 2019-04-10 DIAGNOSIS — S68122A Partial traumatic metacarpophalangeal amputation of right middle finger, initial encounter: Secondary | ICD-10-CM | POA: Diagnosis not present

## 2019-04-10 DIAGNOSIS — W208XXA Other cause of strike by thrown, projected or falling object, initial encounter: Secondary | ICD-10-CM | POA: Insufficient documentation

## 2019-04-10 DIAGNOSIS — S68629A Partial traumatic transphalangeal amputation of unspecified finger, initial encounter: Secondary | ICD-10-CM

## 2019-04-10 DIAGNOSIS — I10 Essential (primary) hypertension: Secondary | ICD-10-CM | POA: Diagnosis not present

## 2019-04-10 DIAGNOSIS — S68112A Complete traumatic metacarpophalangeal amputation of right middle finger, initial encounter: Secondary | ICD-10-CM | POA: Insufficient documentation

## 2019-04-10 DIAGNOSIS — S67194A Crushing injury of right ring finger, initial encounter: Secondary | ICD-10-CM | POA: Diagnosis not present

## 2019-04-10 DIAGNOSIS — Z20822 Contact with and (suspected) exposure to covid-19: Secondary | ICD-10-CM | POA: Diagnosis not present

## 2019-04-10 DIAGNOSIS — S68124A Partial traumatic metacarpophalangeal amputation of right ring finger, initial encounter: Secondary | ICD-10-CM | POA: Insufficient documentation

## 2019-04-10 DIAGNOSIS — S67192A Crushing injury of right middle finger, initial encounter: Secondary | ICD-10-CM | POA: Diagnosis not present

## 2019-04-10 DIAGNOSIS — S68119A Complete traumatic metacarpophalangeal amputation of unspecified finger, initial encounter: Secondary | ICD-10-CM

## 2019-04-10 DIAGNOSIS — F17210 Nicotine dependence, cigarettes, uncomplicated: Secondary | ICD-10-CM | POA: Diagnosis not present

## 2019-04-10 HISTORY — PX: I & D EXTREMITY: SHX5045

## 2019-04-10 HISTORY — PX: PERCUTANEOUS PINNING: SHX2209

## 2019-04-10 LAB — CBC WITH DIFFERENTIAL/PLATELET
Abs Immature Granulocytes: 0.07 10*3/uL (ref 0.00–0.07)
Basophils Absolute: 0 10*3/uL (ref 0.0–0.1)
Basophils Relative: 1 %
Eosinophils Absolute: 0.1 10*3/uL (ref 0.0–0.5)
Eosinophils Relative: 1 %
HCT: 46.5 % (ref 39.0–52.0)
Hemoglobin: 15.5 g/dL (ref 13.0–17.0)
Immature Granulocytes: 1 %
Lymphocytes Relative: 32 %
Lymphs Abs: 2.7 10*3/uL (ref 0.7–4.0)
MCH: 30.2 pg (ref 26.0–34.0)
MCHC: 33.3 g/dL (ref 30.0–36.0)
MCV: 90.6 fL (ref 80.0–100.0)
Monocytes Absolute: 0.8 10*3/uL (ref 0.1–1.0)
Monocytes Relative: 9 %
Neutro Abs: 4.8 10*3/uL (ref 1.7–7.7)
Neutrophils Relative %: 56 %
Platelets: 205 10*3/uL (ref 150–400)
RBC: 5.13 MIL/uL (ref 4.22–5.81)
RDW: 11.9 % (ref 11.5–15.5)
WBC: 8.5 10*3/uL (ref 4.0–10.5)
nRBC: 0 % (ref 0.0–0.2)

## 2019-04-10 LAB — BASIC METABOLIC PANEL
Anion gap: 13 (ref 5–15)
BUN: 15 mg/dL (ref 6–20)
CO2: 25 mmol/L (ref 22–32)
Calcium: 9.6 mg/dL (ref 8.9–10.3)
Chloride: 98 mmol/L (ref 98–111)
Creatinine, Ser: 1.14 mg/dL (ref 0.61–1.24)
GFR calc Af Amer: >60 mL/min (ref >60)
GFR calc non Af Amer: >60 mL/min (ref >60)
Glucose, Bld: 147 mg/dL — ABNORMAL HIGH (ref 70–99)
Potassium: 4.1 mmol/L (ref 3.5–5.1)
Sodium: 136 mmol/L (ref 135–145)

## 2019-04-10 LAB — RESPIRATORY PANEL BY RT PCR (FLU A&B, COVID)
Influenza A by PCR: NEGATIVE
Influenza B by PCR: NEGATIVE
SARS Coronavirus 2 by RT PCR: NEGATIVE

## 2019-04-10 SURGERY — IRRIGATION AND DEBRIDEMENT EXTREMITY
Anesthesia: General | Site: Finger | Laterality: Right

## 2019-04-10 MED ORDER — CHLORHEXIDINE GLUCONATE 4 % EX LIQD
60.0000 mL | Freq: Once | CUTANEOUS | Status: DC
  Filled 2019-04-10: qty 60

## 2019-04-10 MED ORDER — CEFAZOLIN SODIUM-DEXTROSE 2-4 GM/100ML-% IV SOLN
2.0000 g | INTRAVENOUS | Status: AC
  Administered 2019-04-10: 18:00:00 2 g via INTRAVENOUS
  Filled 2019-04-10: qty 100

## 2019-04-10 MED ORDER — HYDROMORPHONE HCL 1 MG/ML IJ SOLN
0.2500 mg | INTRAMUSCULAR | Status: DC | PRN
  Administered 2019-04-10 (×4): 0.5 mg via INTRAVENOUS

## 2019-04-10 MED ORDER — PROMETHAZINE HCL 25 MG/ML IJ SOLN: 6.2500 mg | INTRAMUSCULAR | Status: DC | PRN

## 2019-04-10 MED ORDER — MIDAZOLAM HCL 2 MG/2ML IJ SOLN: 0.5000 mg | Freq: Once | INTRAMUSCULAR | Status: DC | PRN

## 2019-04-10 MED ORDER — SODIUM CHLORIDE 0.9 % IR SOLN
Status: DC | PRN
  Administered 2019-04-10: 3000 mL

## 2019-04-10 MED ORDER — CEFAZOLIN SODIUM-DEXTROSE 2-4 GM/100ML-% IV SOLN
2.0000 g | Freq: Once | INTRAVENOUS | Status: AC
  Administered 2019-04-10: 2 g via INTRAVENOUS
  Filled 2019-04-10: qty 100

## 2019-04-10 MED ORDER — LIDOCAINE 2% (20 MG/ML) 5 ML SYRINGE
INTRAMUSCULAR | Status: AC
  Filled 2019-04-10: qty 10

## 2019-04-10 MED ORDER — ACETAMINOPHEN 10 MG/ML IV SOLN
1000.0000 mg | Freq: Once | INTRAVENOUS | Status: AC
  Administered 2019-04-10: 1000 mg via INTRAVENOUS

## 2019-04-10 MED ORDER — SUCCINYLCHOLINE CHLORIDE 20 MG/ML IJ SOLN
INTRAMUSCULAR | Status: DC | PRN
  Administered 2019-04-10: 140 mg via INTRAVENOUS

## 2019-04-10 MED ORDER — LACTATED RINGERS IV SOLN: INTRAVENOUS | Status: DC

## 2019-04-10 MED ORDER — HYDROMORPHONE HCL 1 MG/ML IJ SOLN
1.0000 mg | Freq: Once | INTRAMUSCULAR | Status: AC
  Administered 2019-04-10: 1 mg via INTRAVENOUS
  Filled 2019-04-10: qty 1

## 2019-04-10 MED ORDER — CEPHALEXIN 500 MG PO CAPS: 500.0000 mg | ORAL_CAPSULE | Freq: Four times a day (QID) | ORAL | 0 refills | Status: AC

## 2019-04-10 MED ORDER — SODIUM CHLORIDE 0.9 % IV BOLUS
1000.0000 mL | Freq: Once | INTRAVENOUS | Status: AC
  Administered 2019-04-10: 1000 mL via INTRAVENOUS

## 2019-04-10 MED ORDER — ONDANSETRON HCL 4 MG/2ML IJ SOLN
4.0000 mg | Freq: Once | INTRAMUSCULAR | Status: AC
  Administered 2019-04-10: 4 mg via INTRAVENOUS
  Filled 2019-04-10: qty 2

## 2019-04-10 MED ORDER — POVIDONE-IODINE 10 % EX SWAB: 2.0000 "application " | Freq: Once | CUTANEOUS | Status: DC

## 2019-04-10 MED ORDER — FENTANYL CITRATE (PF) 250 MCG/5ML IJ SOLN
INTRAMUSCULAR | Status: AC
  Filled 2019-04-10: qty 5

## 2019-04-10 MED ORDER — LIDOCAINE HCL (PF) 1 % IJ SOLN
INTRAMUSCULAR | Status: AC
  Filled 2019-04-10: qty 30

## 2019-04-10 MED ORDER — MIDAZOLAM HCL 2 MG/2ML IJ SOLN
INTRAMUSCULAR | Status: AC
  Filled 2019-04-10: qty 2

## 2019-04-10 MED ORDER — LIDOCAINE HCL (CARDIAC) PF 100 MG/5ML IV SOSY
PREFILLED_SYRINGE | INTRAVENOUS | Status: DC | PRN
  Administered 2019-04-10: 80 mg via INTRATRACHEAL

## 2019-04-10 MED ORDER — SUGAMMADEX SODIUM 200 MG/2ML IV SOLN
INTRAVENOUS | Status: DC | PRN
  Administered 2019-04-10: 250 mg via INTRAVENOUS

## 2019-04-10 MED ORDER — TETANUS-DIPHTH-ACELL PERTUSSIS 5-2.5-18.5 LF-MCG/0.5 IM SUSP
0.5000 mL | Freq: Once | INTRAMUSCULAR | Status: AC
  Administered 2019-04-10: 0.5 mL via INTRAMUSCULAR
  Filled 2019-04-10: qty 0.5

## 2019-04-10 MED ORDER — LACTATED RINGERS IV SOLN: INTRAVENOUS | Status: DC | PRN

## 2019-04-10 MED ORDER — HYDROCODONE-ACETAMINOPHEN 5-325 MG PO TABS: 1.0000 | ORAL_TABLET | ORAL | 0 refills | Status: DC | PRN

## 2019-04-10 MED ORDER — PROPOFOL 10 MG/ML IV BOLUS
INTRAVENOUS | Status: DC | PRN
  Administered 2019-04-10: 200 mg via INTRAVENOUS

## 2019-04-10 MED ORDER — CEFAZOLIN SODIUM-DEXTROSE 1-4 GM/50ML-% IV SOLN
1.0000 g | Freq: Once | INTRAVENOUS | Status: AC
  Administered 2019-04-10: 1 g via INTRAVENOUS
  Filled 2019-04-10: qty 50

## 2019-04-10 MED ORDER — MEPERIDINE HCL 25 MG/ML IJ SOLN: 6.2500 mg | INTRAMUSCULAR | Status: DC | PRN

## 2019-04-10 MED ORDER — 0.9 % SODIUM CHLORIDE (POUR BTL) OPTIME
TOPICAL | Status: DC | PRN
  Administered 2019-04-10: 1000 mL

## 2019-04-10 MED ORDER — ROCURONIUM BROMIDE 10 MG/ML (PF) SYRINGE
PREFILLED_SYRINGE | INTRAVENOUS | Status: DC | PRN
  Administered 2019-04-10: 50 mg via INTRAVENOUS

## 2019-04-10 MED ORDER — GLYCOPYRROLATE PF 0.2 MG/ML IJ SOSY
PREFILLED_SYRINGE | INTRAMUSCULAR | Status: AC
  Filled 2019-04-10: qty 1

## 2019-04-10 MED ORDER — MIDAZOLAM HCL 5 MG/5ML IJ SOLN
INTRAMUSCULAR | Status: DC | PRN
  Administered 2019-04-10: 2 mg via INTRAVENOUS

## 2019-04-10 MED ORDER — HYDROMORPHONE HCL 1 MG/ML IJ SOLN
INTRAMUSCULAR | Status: AC
  Filled 2019-04-10: qty 1

## 2019-04-10 MED ORDER — ONDANSETRON HCL 4 MG/2ML IJ SOLN
INTRAMUSCULAR | Status: DC | PRN
  Administered 2019-04-10: 4 mg via INTRAVENOUS

## 2019-04-10 MED ORDER — MORPHINE SULFATE (PF) 4 MG/ML IV SOLN
4.0000 mg | Freq: Once | INTRAVENOUS | Status: AC
  Administered 2019-04-10: 4 mg via INTRAVENOUS
  Filled 2019-04-10: qty 1

## 2019-04-10 MED ORDER — HYDROMORPHONE HCL 1 MG/ML IJ SOLN: 0.5000 mg | INTRAMUSCULAR | Status: DC | PRN

## 2019-04-10 MED ORDER — ACETAMINOPHEN 10 MG/ML IV SOLN
INTRAVENOUS | Status: AC
  Filled 2019-04-10: qty 100

## 2019-04-10 MED ORDER — PROPOFOL 10 MG/ML IV BOLUS
INTRAVENOUS | Status: AC
  Filled 2019-04-10: qty 20

## 2019-04-10 MED ORDER — FENTANYL CITRATE (PF) 250 MCG/5ML IJ SOLN
INTRAMUSCULAR | Status: DC | PRN
  Administered 2019-04-10 (×5): 50 ug via INTRAVENOUS

## 2019-04-10 SURGICAL SUPPLY — 46 items
BNDG CMPR 9X4 STRL LF SNTH (GAUZE/BANDAGES/DRESSINGS) ×1
BNDG ELASTIC 4X5.8 VLCR STR LF (GAUZE/BANDAGES/DRESSINGS) ×2 IMPLANT
BNDG ESMARK 4X9 LF (GAUZE/BANDAGES/DRESSINGS) ×3 IMPLANT
BNDG GAUZE ELAST 4 BULKY (GAUZE/BANDAGES/DRESSINGS) ×2 IMPLANT
CAP PIN PROTECTOR ORTHO WHT (CAP) ×2 IMPLANT
CORD BIPOLAR FORCEPS 12FT (ELECTRODE) ×3 IMPLANT
COVER SURGICAL LIGHT HANDLE (MISCELLANEOUS) ×3 IMPLANT
CUFF TOURN SGL QUICK 18X4 (TOURNIQUET CUFF) ×2 IMPLANT
DRAPE INCISE IOBAN 66X45 STRL (DRAPES) ×2 IMPLANT
DRAPE OEC MINIVIEW 54X84 (DRAPES) ×4 IMPLANT
GAUZE SPONGE 4X4 12PLY STRL LF (GAUZE/BANDAGES/DRESSINGS) ×2 IMPLANT
GAUZE XEROFORM 5X9 LF (GAUZE/BANDAGES/DRESSINGS) ×2 IMPLANT
GLOVE BIO SURGEON STRL SZ7.5 (GLOVE) ×3 IMPLANT
GLOVE BIO SURGEON STRL SZ8 (GLOVE) ×3 IMPLANT
GLOVE BIOGEL M STRL SZ7.5 (GLOVE) ×5 IMPLANT
GLOVE BIOGEL PI IND STRL 8 (GLOVE) ×1 IMPLANT
GLOVE BIOGEL PI INDICATOR 8 (GLOVE) ×4
GOWN STRL REUS W/ TWL LRG LVL3 (GOWN DISPOSABLE) ×3 IMPLANT
GOWN STRL REUS W/TWL LRG LVL3 (GOWN DISPOSABLE) ×9
GUIDE NRV 3X2RSRB NEURAGEN 2M (Tissue) IMPLANT
K-WIRE 0.9X102 (Wire) ×3 IMPLANT
K-WIRE SMTH SNGL TROCAR .045X9 (WIRE) ×3
KIT BASIN OR (CUSTOM PROCEDURE TRAY) ×3 IMPLANT
KIT TURNOVER KIT B (KITS) ×3 IMPLANT
KWIRE .045 x 9 SMTH single trocar (Wire) ×2 IMPLANT
KWIRE 0.9X102 (Wire) IMPLANT
KWIRE SMTH SNGL TROCAR .045X9 (WIRE) IMPLANT
NERVE GUIDE NEURAGEN 2MM (Tissue) ×3 IMPLANT
NS IRRIG 1000ML POUR BTL (IV SOLUTION) ×3 IMPLANT
PACK ORTHO EXTREMITY (CUSTOM PROCEDURE TRAY) ×3 IMPLANT
PAD CAST 4YDX4 CTTN HI CHSV (CAST SUPPLIES) IMPLANT
PADDING CAST COTTON 4X4 STRL (CAST SUPPLIES) ×3
SET CYSTO W/LG BORE CLAMP LF (SET/KITS/TRAYS/PACK) ×2 IMPLANT
SOL PREP POV-IOD 4OZ 10% (MISCELLANEOUS) ×6 IMPLANT
SPLINT PLASTER EXTRA FAST 3X15 (CAST SUPPLIES) ×2
SPLINT PLASTER GYPS XFAST 3X15 (CAST SUPPLIES) IMPLANT
SPONGE LAP 4X18 RFD (DISPOSABLE) ×3 IMPLANT
SUCTION FRAZIER HANDLE 10FR (MISCELLANEOUS) ×3
SUCTION TUBE FRAZIER 10FR DISP (MISCELLANEOUS) ×1 IMPLANT
SUT CHROMIC 4 0 RB 1X27 (SUTURE) ×8 IMPLANT
SUT ETHIBOND 3 0 SH 1 (SUTURE) ×2 IMPLANT
SUT ETHILON 8 0 BV130 4 (SUTURE) ×2 IMPLANT
TOWEL GREEN STERILE (TOWEL DISPOSABLE) ×3 IMPLANT
TOWEL GREEN STERILE FF (TOWEL DISPOSABLE) ×3 IMPLANT
TUBE CONNECTING 12'X1/4 (SUCTIONS) ×1
TUBE CONNECTING 12X1/4 (SUCTIONS) ×2 IMPLANT

## 2019-04-10 NOTE — Brief Op Note (Signed)
04/10/2019  8:43 PM  PATIENT:  Marc Hebert  29 y.o. male  PRE-OPERATIVE DIAGNOSIS:  Right long/ring finger amputation  POST-OPERATIVE DIAGNOSIS:  Right long/ring finger amputation  PROCEDURE:  Procedure(s): IRRIGATION AND DEBRIDEMENT OF RIGHT LONG ND RING FINGERS . (Right) PERCUTANEOUS PINNING RIGHT LONG AND RING FINGERS. (Right)  SURGEON:  Surgeon(s) and Role:    * Trivia Heffelfinger, Wendy Poet, MD - Primary  PHYSICIAN ASSISTANT: Materials engineer, PA  ASSISTANTS: none   ANESTHESIA:   general  EBL:  50   BLOOD ADMINISTERED:none  DRAINS: none   LOCAL MEDICATIONS USED:  NONE  SPECIMEN:  No Specimen  DISPOSITION OF SPECIMEN:  N/A  COUNTS:  YES  TOURNIQUET:   Total Tourniquet Time Documented: Upper Arm (Right) - 86 minutes Total: Upper Arm (Right) - 86 minutes   DICTATION: .Reubin Milan Dictation  PLAN OF CARE: Discharge to home after PACU  PATIENT DISPOSITION:  PACU - hemodynamically stable.   Delay start of Pharmacological VTE agent (>24hrs) due to surgical blood loss or risk of bleeding: not applicable

## 2019-04-10 NOTE — Transfer of Care (Signed)
Immediate Anesthesia Transfer of Care Note  Patient: AKSHATH MCCAREY  Procedure(s) Performed: IRRIGATION AND DEBRIDEMENT OF RIGHT LONG ND RING FINGERS . (Right Finger) PERCUTANEOUS PINNING RIGHT LONG AND RING FINGERS. (Right Finger)  Patient Location: PACU  Anesthesia Type:General  Level of Consciousness: awake, alert , oriented and patient cooperative  Airway & Oxygen Therapy: Patient Spontanous Breathing and Patient connected to nasal cannula oxygen  Post-op Assessment: Report given to RN and Post -op Vital signs reviewed and stable  Post vital signs: Reviewed and stable  Last Vitals:  Vitals Value Taken Time  BP 162/86 04/10/19 2102  Temp    Pulse 89 04/10/19 2104  Resp 20 04/10/19 2104  SpO2 96 % 04/10/19 2104  Vitals shown include unvalidated device data.  Last Pain:  Vitals:   04/10/19 1509  TempSrc:   PainSc: 8          Complications: No apparent anesthesia complications

## 2019-04-10 NOTE — ED Notes (Signed)
Soaked patient hand in some saline and betadine patient is resting with call bell in and family at bedside

## 2019-04-10 NOTE — Anesthesia Procedure Notes (Signed)
Procedure Name: Intubation Date/Time: 04/10/2019 5:38 PM Performed by: Amadeo Garnet, CRNA Pre-anesthesia Checklist: Emergency Drugs available, Patient identified, Suction available and Patient being monitored Patient Re-evaluated:Patient Re-evaluated prior to induction Oxygen Delivery Method: Circle system utilized Preoxygenation: Pre-oxygenation with 100% oxygen Induction Type: IV induction and Rapid sequence Laryngoscope Size: Mac and 4 Grade View: Grade I Tube type: Oral Tube size: 7.5 mm Number of attempts: 1 Airway Equipment and Method: Stylet Placement Confirmation: ETT inserted through vocal cords under direct vision,  positive ETCO2 and breath sounds checked- equal and bilateral Secured at: 23 cm Tube secured with: Tape Dental Injury: Teeth and Oropharynx as per pre-operative assessment

## 2019-04-10 NOTE — H&P (View-Only) (Signed)
Reason for Consult:Right hand injury Referring Physician: D Guillaume [name] is an 29 y.o. male.  HPI: Marc Hebert was working with a large piece of metal that was being lowered onto a table. It slipped off the chain and came down on his right hand. The long finger was severed and the ring finger injured significantly. He was brought to the ED for further evaluation and hand surgery was consulted. 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  Past Medical History:  Diagnosis Date  . Hypertension     No past surgical history on file.  Family History  Problem Relation Age of Onset  . Hypertension Mother   . Hypertension Father   . Hypertension Brother   . Hypertension Other     Social History:  reports that he has been smoking cigarettes. He has been smoking about 0.50 packs per day. His smokeless tobacco use includes chew. He reports current alcohol use. He reports that he does not use drugs.  Allergies: No Known Allergies  Medications: I have reviewed the patient's current medications.  No results found for this or any previous visit (from the past 48 hour(s)).  DG Hand Complete Right  Result Date: 04/10/2019 CLINICAL DATA:  Injury with amputation EXAM: RIGHT HAND - COMPLETE 3+ VIEW COMPARISON:  None. FINDINGS: Frontal, oblique, and lateral views were obtained. There has been amputation at the level of the proximal portion of the third middle phalanx. There is a comminuted fracture involving the proximal aspect of the fourth middle phalanx with fracture extending through the medullary portion of the bone distally. There is medial displacement of major distal fracture fragments compared to major proximal fragments. No other fractures are evident. No dislocation. No appreciable joint space narrowing or erosion. IMPRESSION: 1. Amputation at the level of the proximal aspect of the third proximal phalanx. 2. Comminuted fracture of the fourth middle phalanx with displaced fracture fragments and  medial displacement and angulation of the distal major fragments with respect to the major proximal fragments. 3. No other fractures evident. No dislocation. No appreciable arthropathy. Electronically Signed   By: Lowella Grip III M.D.   On: 04/10/2019 13:40    Review of Systems  HENT: Negative for ear discharge, ear pain, hearing loss and tinnitus.   Eyes: Negative for photophobia and pain.  Respiratory: Negative for cough and shortness of breath.   Cardiovascular: Negative for chest pain.  Gastrointestinal: Negative for abdominal pain, nausea and vomiting.  Genitourinary: Negative for dysuria, flank pain, frequency and urgency.  Musculoskeletal: Positive for arthralgias (Right hand). Negative for back pain, myalgias and neck pain.  Neurological: Negative for dizziness and headaches.  Hematological: Does not bruise/bleed easily.  Psychiatric/Behavioral: The patient is not nervous/anxious.    There were no vitals taken for this visit. Physical Exam  Constitutional: He appears well-developed and well-nourished. No distress.  HENT:  Head: Normocephalic and atraumatic.  Eyes: Conjunctivae are normal. Right eye exhibits no discharge. Left eye exhibits no discharge. No scleral icterus.  Cardiovascular: Normal rate and regular rhythm.  Respiratory: Effort normal. No respiratory distress.  Musculoskeletal:     Cervical back: Normal range of motion.     Comments: UEx shoulder, elbow, wrist, digits- Traumatic amputation long finger through P2, near amputation of ring finger at PIP joint, sensation intact ulna>radial, cap refill ~3s, no instability, no blocks to motion  Sens  Ax/R/M/U intact  Mot   Ax/ R/ PIN/ M/ AIN/ U intact  Rad 2+  Neurological: He is  alert.  Skin: Skin is warm and dry. He is not diaphoretic.  Psychiatric: He has a normal mood and affect. His behavior is normal.        Assessment/Plan: Right hand injury -- To OR for revision amputation right long/ring fingers by  Dr. Arita Miss. Anticipate discharge after surgery.    Freeman Caldron, PA-C Orthopedic Surgery [phone] 04/10/2019, 1:45 PM   Agree with above.  Plan for surgery today.  Discussed possibility of not being able to salvage ring finger.  Would not replant crush injury mechanism.  Patient wants to get back to work asap so wishes to amputate ring finger if looks questionable in OR.  I explained I'd pin and fix surrounding structures if it was perfused.

## 2019-04-10 NOTE — Consult Note (Addendum)
Reason for Consult:Right hand injury Referring Physician: D Yao  Marc Hebert is an 29 y.o. male.  HPI: Deavon was working with a large piece of metal that was being lowered onto a table. It slipped off the chain and came down on his right hand. The long finger was severed and the ring finger injured significantly. He was brought to the ED for further evaluation and hand surgery was consulted. He is RHD and works as a welder.  Past Medical History:  Diagnosis Date  . Hypertension     No past surgical history on file.  Family History  Problem Relation Age of Onset  . Hypertension Mother   . Hypertension Father   . Hypertension Brother   . Hypertension Other     Social History:  reports that he has been smoking cigarettes. He has been smoking about 0.50 packs per day. His smokeless tobacco use includes chew. He reports current alcohol use. He reports that he does not use drugs.  Allergies: No Known Allergies  Medications: I have reviewed the patient's current medications.  No results found for this or any previous visit (from the past 48 hour(s)).  DG Hand Complete Right  Result Date: 04/10/2019 CLINICAL DATA:  Injury with amputation EXAM: RIGHT HAND - COMPLETE 3+ VIEW COMPARISON:  None. FINDINGS: Frontal, oblique, and lateral views were obtained. There has been amputation at the level of the proximal portion of the third middle phalanx. There is a comminuted fracture involving the proximal aspect of the fourth middle phalanx with fracture extending through the medullary portion of the bone distally. There is medial displacement of major distal fracture fragments compared to major proximal fragments. No other fractures are evident. No dislocation. No appreciable joint space narrowing or erosion. IMPRESSION: 1. Amputation at the level of the proximal aspect of the third proximal phalanx. 2. Comminuted fracture of the fourth middle phalanx with displaced fracture fragments and  medial displacement and angulation of the distal major fragments with respect to the major proximal fragments. 3. No other fractures evident. No dislocation. No appreciable arthropathy. Electronically Signed   By: William  Woodruff III M.D.   On: 04/10/2019 13:40    Review of Systems  HENT: Negative for ear discharge, ear pain, hearing loss and tinnitus.   Eyes: Negative for photophobia and pain.  Respiratory: Negative for cough and shortness of breath.   Cardiovascular: Negative for chest pain.  Gastrointestinal: Negative for abdominal pain, nausea and vomiting.  Genitourinary: Negative for dysuria, flank pain, frequency and urgency.  Musculoskeletal: Positive for arthralgias (Right hand). Negative for back pain, myalgias and neck pain.  Neurological: Negative for dizziness and headaches.  Hematological: Does not bruise/bleed easily.  Psychiatric/Behavioral: The patient is not nervous/anxious.    There were no vitals taken for this visit. Physical Exam  Constitutional: He appears well-developed and well-nourished. No distress.  HENT:  Head: Normocephalic and atraumatic.  Eyes: Conjunctivae are normal. Right eye exhibits no discharge. Left eye exhibits no discharge. No scleral icterus.  Cardiovascular: Normal rate and regular rhythm.  Respiratory: Effort normal. No respiratory distress.  Musculoskeletal:     Cervical back: Normal range of motion.     Comments: UEx shoulder, elbow, wrist, digits- Traumatic amputation long finger through P2, near amputation of ring finger at PIP joint, sensation intact ulna>radial, cap refill ~3s, no instability, no blocks to motion  Sens  Ax/R/M/U intact  Mot   Ax/ R/ PIN/ M/ AIN/ U intact  Rad 2+  Neurological: He is   alert.  Skin: Skin is warm and dry. He is not diaphoretic.  Psychiatric: He has a normal mood and affect. His behavior is normal.        Assessment/Plan: Right hand injury -- To OR for revision amputation right long/ring fingers by  Dr. Arita Miss. Anticipate discharge after surgery.    Freeman Caldron, PA-C Orthopedic Surgery [phone] 04/10/2019, 1:45 PM   Agree with above.  Plan for surgery today.  Discussed possibility of not being able to salvage ring finger.  Would not replant crush injury mechanism.  Patient wants to get back to work asap so wishes to amputate ring finger if looks questionable in OR.  I explained I'd pin and fix surrounding structures if it was perfused.

## 2019-04-10 NOTE — ED Provider Notes (Signed)
[facility] Provider Note   CSN: [id number] Arrival date & time: 04/10/19  1314     History Chief Complaint  Patient presents with  . Hand Injury    Marc Hebert is a 29 y.o. male.  HPI   29 year old right handed male with a history of hypertension presenting for evaluation of finger amputation.  States Marc Hebert was at work prior to arrival when Marc Hebert had an accident with one of the machines and smashed his fingers in between a piece of metal and a table.  This caused his right middle digit to be amputated. Marc Hebert brought the digit with him.  Marc Hebert also has an injury to the left fourth digit it is partially amputated.  Marc Hebert does have sensation distal to the wound.  Marc Hebert denies any other injuries.  Marc Hebert is not sure when his last tetanus shot was.  Past Medical History:  Diagnosis Date  . Hypertension     Patient Active Problem List   Diagnosis Date Noted  . Soft tissue swelling   . Dental infection 05/31/2016    History reviewed. No pertinent surgical history.     Family History  Problem Relation Age of Onset  . Hypertension Mother   . Hypertension Father   . Hypertension Brother   . Hypertension Other     Social History   Tobacco Use  . Smoking status: Current Every Day Smoker    Packs/day: 0.50    Types: Cigarettes  . Smokeless tobacco: Current User    Types: Chew  Substance Use Topics  . Alcohol use: Yes    Comment: 2x week  . Drug use: No    Home Medications Prior to Admission medications   Medication Sig Start Date End Date Taking? Authorizing Provider  ibuprofen (ADVIL,MOTRIN) 800 MG tablet Take 1 tablet (800 mg total) by mouth every 8 (eight) hours as needed. Patient not taking: Reported on 04/10/2019 06/02/16   Alphonzo Grieve, MD  oxyCODONE-acetaminophen (PERCOCET) 10-325 MG tablet Take 0.5-1 tablets by mouth every 8 (eight) hours as needed for pain. Patient not taking: Reported on 04/10/2019 06/02/16   Alphonzo Grieve, MD    senna-docusate (SENOKOT-S) 8.6-50 MG tablet Take 1 tablet by mouth at bedtime. Patient not taking: Reported on 04/10/2019 06/02/16   Alphonzo Grieve, MD    Allergies    Patient has no known allergies.  Review of Systems   Review of Systems  Constitutional: Negative for fever.  HENT: Negative for dental problem.   Eyes: Negative for visual disturbance.  Respiratory: Negative for shortness of breath.   Cardiovascular: Negative for chest pain.  Gastrointestinal: Negative for abdominal pain.  Genitourinary: Negative for flank pain.  Musculoskeletal:       Right 3rd digit amputation, right 4th digit partially amputated  Skin: Positive for wound.  Neurological: Negative for weakness and numbness.    Physical Exam Updated Vital Signs BP (!) 152/80 (BP Location: Right Arm)   Pulse 86   Temp 98.5 F (36.9 C) (Oral)   Resp 17   Ht 6\' 4"  (1.93 m)   Wt 130.2 kg   SpO2 98%   BMI 34.93 kg/m   Physical Exam Constitutional:      General: Marc Hebert is not in acute distress.    Appearance: Marc Hebert is well-developed.  Eyes:     Conjunctiva/sclera: Conjunctivae normal.  Cardiovascular:     Rate and Rhythm: Normal rate.  Pulmonary:     Effort: Pulmonary effort is normal.  Musculoskeletal:  Comments: Right 3rd digit is amputated just distal to the PIP. Right 4th digit is partially amputated. Marc Hebert does have sensation distal to the wound and cap refill is intact.   Skin:    General: Skin is warm and dry.  Neurological:     Mental Status: Marc Hebert is alert and oriented to person, place, and time.            ED Results / Procedures / Treatments   Labs (all labs ordered are listed, but only abnormal results are displayed) Labs Reviewed  BASIC METABOLIC PANEL - Abnormal; Notable for the following components:      Result Value   Glucose, Bld 147 (*)    All other components within normal limits  RESPIRATORY PANEL BY RT PCR (FLU A&B, COVID)  CBC WITH DIFFERENTIAL/PLATELET     EKG None  Radiology DG Hand Complete Right  Result Date: 04/10/2019 CLINICAL DATA:  Injury with amputation EXAM: RIGHT HAND - COMPLETE 3+ VIEW COMPARISON:  None. FINDINGS: Frontal, oblique, and lateral views were obtained. There has been amputation at the level of the proximal portion of the third middle phalanx. There is a comminuted fracture involving the proximal aspect of the fourth middle phalanx with fracture extending through the medullary portion of the bone distally. There is medial displacement of major distal fracture fragments compared to major proximal fragments. No other fractures are evident. No dislocation. No appreciable joint space narrowing or erosion. IMPRESSION: 1. Amputation at the level of the proximal aspect of the third proximal phalanx. 2. Comminuted fracture of the fourth middle phalanx with displaced fracture fragments and medial displacement and angulation of the distal major fragments with respect to the major proximal fragments. 3. No other fractures evident. No dislocation. No appreciable arthropathy. Electronically Signed   By: Bretta Bang III M.D.   On: 04/10/2019 13:40    Procedures .Nerve Block  Date/Time: 04/10/2019 5:44 PM Performed by: Karrie Meres, PA-C Authorized by: Karrie Meres, PA-C   Consent:    Consent obtained:  Verbal   Consent given by:  Patient   Risks discussed:  Infection, swelling, bleeding, unsuccessful block and pain   Alternatives discussed:  No treatment Indications:    Indications:  Pain relief Location:    Nerve block body site: 3rd digit.   Laterality:  Right Pre-procedure details:    Skin preparation:  Alcohol   Preparation: Patient was prepped and draped in usual sterile fashion   Skin anesthesia (see MAR for exact dosages):    Skin anesthesia method:  None Procedure details (see MAR for exact dosages):    Block needle gauge:  25 G   Anesthetic injected:  Lidocaine 1% w/o epi   Steroid injected:   None   Additive injected:  None   Injection procedure:  Anatomic landmarks identified, anatomic landmarks palpated, introduced needle, incremental injection and negative aspiration for blood   Paresthesia:  Immediately resolved Post-procedure details:    Dressing:  None   Patient tolerance of procedure:  Tolerated well, no immediate complications   (including critical care time)  Medications Ordered in ED Medications  chlorhexidine (HIBICLENS) 4 % liquid 4 application (has no administration in time range)  povidone-iodine 10 % swab 2 application (has no administration in time range)  ceFAZolin (ANCEF) IVPB 2g/100 mL premix (has no administration in time range)  lactated ringers infusion ( Intravenous New Bag/Given 04/10/19 1659)  Tdap (BOOSTRIX) injection 0.5 mL (0.5 mLs Intramuscular Given 04/10/19 1351)  ceFAZolin (ANCEF) IVPB 2g/100  mL premix (0 g Intravenous Stopped 04/10/19 1433)  sodium chloride 0.9 % bolus 1,000 mL (1,000 mLs Intravenous New Bag/Given 04/10/19 1358)  morphine 4 MG/ML injection 4 mg (4 mg Intravenous Given 04/10/19 1350)  ondansetron (ZOFRAN) injection 4 mg (4 mg Intravenous Given 04/10/19 1349)  lidocaine (PF) (XYLOCAINE) 1 % injection (  Given 04/10/19 1422)  ceFAZolin (ANCEF) IVPB 1 g/50 mL premix (0 g Intravenous Stopped 04/10/19 1547)  HYDROmorphone (DILAUDID) injection 1 mg (1 mg Intravenous Given 04/10/19 1447)  HYDROmorphone (DILAUDID) injection 1 mg (1 mg Intravenous Given 04/10/19 1555)    ED Course  I have reviewed the triage vital signs and the nursing notes.  Pertinent labs & imaging results that were available during my care of the patient were reviewed by me and considered in my medical decision making (see chart for details).    MDM Rules/Calculators/A&P                      29 year old male presenting for evaluation of finger amputation of the right third digit and partial amputation of the right fourth digit that occurred while at work just prior to  arrival.  X-ray of the right hand was completed which showed an amputation at the level of the proximal aspect of the third proximal phalanx.  It also showed a comminuted fracture of the fourth middle phalanx with displaced fracture fragments and medial displacement and angulation of the distal major fracture fragments.  X-ray was personally reviewed and I agree with the radiologist.  Screening labs are ordered, Covid test was ordered.  Patient was given pain medications, fluids and Ancef.  Orthopedic PA, Charma Igo, came to the bedside to evaluate the patient.  Marc Hebert will discuss the case with hand surgery.  Pt taken to the OR by hand surgery.   Final Clinical Impression(s) / ED Diagnoses Final diagnoses:  None    Rx / DC Orders ED Discharge Orders    None       Karrie Meres, PA-C 04/10/19 1746    Charlynne Pander, MD 04/11/19 [phone]

## 2019-04-10 NOTE — Anesthesia Preprocedure Evaluation (Addendum)
Anesthesia Evaluation  Patient identified by MRN, date of birth, ID band Patient awake    Reviewed: Allergy & Precautions, NPO status , Patient's Chart, lab work & pertinent test results  History of Anesthesia Complications Negative for: history of anesthetic complications  Airway Mallampati: II  TM Distance: >3 FB Neck ROM: Full    Dental  (+) Dental Advisory Given   Pulmonary Current SmokerPatient did not abstain from smoking.,  04/10/2019 SARS coronavirus NEG   breath sounds clear to auscultation       Cardiovascular hypertension (no medications),  Rhythm:Regular Rate:Normal     Neuro/Psych negative neurological ROS     GI/Hepatic negative GI ROS, Neg liver ROS,   Endo/Other  obese  Renal/GU negative Renal ROS     Musculoskeletal   Abdominal (+) + obese,   Peds  Hematology negative hematology ROS (+)   Anesthesia Other Findings   Reproductive/Obstetrics                            Anesthesia Physical Anesthesia Plan  ASA: II and emergent  Anesthesia Plan: General   Post-op Pain Management:    Induction: Intravenous  PONV Risk Score and Plan: 1 and Ondansetron and Dexamethasone  Airway Management Planned: Oral ETT  Additional Equipment: None  Intra-op Plan:   Post-operative Plan: Extubation in OR  Informed Consent: I have reviewed the patients History and Physical, chart, labs and discussed the procedure including the risks, benefits and alternatives for the proposed anesthesia with the patient or authorized representative who has indicated his/her understanding and acceptance.     Dental advisory given  Plan Discussed with: CRNA and Surgeon  Anesthesia Plan Comments:        Anesthesia Quick Evaluation

## 2019-04-10 NOTE — Discharge Instructions (Addendum)
Activity As tolerated: NO showers unless you are able to completely cover the splint and ACE wrap over your hand/forearm.  NO driving while in pain, taking pain medications or if you are unable to react to traffic, shift your car into gear or feel unsafe driving. No heavy activities  Diet: Regular  Wound Care: Keep dressing clean & dry Do not change dressings DO NOT remove splint. Keep your hands clean to prevent increased risk of infection.  Special Instructions: Call Doctor if any unusual problems occur such as pain, excessive Bleeding, unrelieved Nausea/vomiting, Fever &/or chills  Follow-up appointment: 2 weeks from surgery. Please call office at [phone] to schedule appointment with Dr. Donalda Ewings will need to have repeat x-ray prior to your appointment with Dr. Arita Miss on either 04/23/19 or 04/24/19.  We will place an order for you to have occupational therapy to assist with therapy.

## 2019-04-10 NOTE — Interval H&P Note (Signed)
History and Physical Interval Note:  04/10/2019 5:23 PM  Willeen Niece  has presented today for surgery, with the diagnosis of Right long/ring finger amputation.  The various methods of treatment have been discussed with the patient and family. After consideration of risks, benefits and other options for treatment, the patient has consented to  Procedure(s): IRRIGATION AND DEBRIDEMENT EXTREMITY (Right) PERCUTANEOUS PINNING Right ring finger (Right) as a surgical intervention.  The patient's history has been reviewed, patient examined, no change in status, stable for surgery.  I have reviewed the patient's chart and labs.  Questions were answered to the patient's satisfaction.     Marc Hebert

## 2019-04-10 NOTE — Op Note (Signed)
Operative Note   DATE OF OPERATION: 04/10/2019  SURGICAL DEPARTMENT: Plastic Surgery  PREOPERATIVE DIAGNOSES: Crush injury to right long and ring fingers with amputation of the long finger  POSTOPERATIVE DIAGNOSES:  same  PROCEDURE: 1.  Irrigation debridement of right long and ring fingers including skin subcutaneous tissue totaling 4 x 5 cm 2.  Open reduction internal fixation of right ring finger middle phalanx fracture 3.  Repair of right ring finger FDP using a slip of the FDS as a tendon graft 4.  Repair of right ring finger extensor tendon 5.  Reconstruction of right ring finger radial digital nerve with nerve tube 6.  Revision amputation of right long finger 7.  Complex closure right ring finger laceration totaling 5 cm in length  SURGEON: Talmadge Coventry, MD  ASSISTANT: Verdie Shire, PA The advanced practice practitioner (APP) assisted throughout the case.  The APP was essential in retraction and counter traction when needed to make the case progress smoothly.  This retraction and assistance made it possible to see the tissue plans for the procedure.  The assistance was needed for blood control, tissue re-approximation and assisted with closure of the incision site.  ANESTHESIA:  General.   COMPLICATIONS: None.   INDICATIONS FOR PROCEDURE:  The patient, Marc Hebert is a 29 y.o. male born on [birth year], is here for treatment of severe crush injury that occurred at work.  A metal beam fell on his right ring and long fingers.  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  The ring finger crush was through the level of the middle phalanx and was a near amputation.  It appeared to remain perfused based on the ulnar neurovascular bundle.  He was sensate on the ulnar side.  The rest of the structures of the finger were all crushed and lacerated with the injury. MRN: [id number]  CONSENT:  Informed consent was obtained directly from the patient. Risks, benefits  and alternatives were fully discussed. Specific risks including but not limited to bleeding, infection, hematoma, seroma, scarring, pain, contracture, asymmetry, wound healing problems, and need for further surgery were all discussed. The patient did have an ample opportunity to have questions answered to satisfaction.  I explained the complexity of the crush injury.  I explained he could have delayed ischemia in the ring finger even though it appears perfused preoperatively.  I explained that even under the best of circumstances he would have limited function of the ring finger in the future.  I explained that in my opinion it was not worthwhile to try to replant the long finger given the mechanism of injury.  He was in agreement with that.  DESCRIPTION OF PROCEDURE:  The patient was taken to the operating room. SCDs were placed and Ancef antibiotics were given.  General anesthesia was administered.  The patient's operative site was prepped and draped in a sterile fashion. A time out was performed and all information was confirmed to be correct.  I started by assessing the fingers.  The thumb index ring and small fingers all had good capillary refill.  Doppler detected pulsatile flow distal to the site of injury in the ring finger corresponding clinically with an intact ulnar neurovascular bundle.  At this point and proceeded with salvage of the finger.  The arm was exsanguinated with an Esmarch and tourniquet inflated to a 50 mmHg.  The wounds were copiously irrigated with 3 L of saline.  All devitalized tissue was debrided with pickups and scissors.  I then started by stabilizing the ring finger.  This was done with 2 longitudinal K wires 1 was 0.035 and the other 0.045.  This gave reasonable bony alignment.  The fracture was heavily comminuted and was in multiple fragments.  The pins crossed both the DIP and PIP joints for stabilization.  I then turned my attention to the flexor tendon.  Loletha Carrow extensions  were made on either side of the laceration away from the remaining neurovascular bundle.  The FDP tendon was identified proximally and distally.  The FDS was still inserting proximal to the site of injury.  There was about a centimeter gap in the tendon after I pulled reasonable amount of tension from proximal.  This Was bridged by harvesting a slip of the FDS tendon tendon and using as a graft.  It was secured in place with multiple figure-of-eight 3-0 Ethibond sutures.  I then turned my attention dorsally.  The laceration were extension was extended in either direction for visualization.  I was able to identify slips of the extensor tendon repair them with interrupted mattress Ethibond sutures.  The extensor mechanism was heavily injured.  I then went back volarly to dissect out the lacerated radial digital nerve.  I was able to identify stumps on either side.  There was about a 1.5 cm gap.  The ends were debrided and the gap was bridged with a oxygen nerve tube.  This was secured in place with interrupted 8-0 nylon sutures.  I then turned my attention to the long finger amputation site.  I debrided the middle phalanx back to leave about a centimeter of bone distal to the PIP joint.  This allowed me to close the remaining skin under minimal tension.  The tourniquet was then let down.  Hemostasis was obtained.  Tourniquet time was 87 minutes.  I then proceeded to close all wounds with buried and interrupted chromic sutures.  He still had excellent capillary refill of the ring finger.  Xeroform was applied to the lacerations covered with a soft wrap and a volar splint.  The pins were coming out distally and they were cut and covered with pin caps.  Patient tolerated procedure well  The patient tolerated the procedure well.  There were no complications. The patient was allowed to wake from anesthesia, extubated and taken to the recovery room in satisfactory condition.

## 2019-04-11 ENCOUNTER — Other Ambulatory Visit: Payer: Self-pay | Admitting: Plastic Surgery

## 2019-04-11 ENCOUNTER — Telehealth: Payer: Self-pay

## 2019-04-11 MED ORDER — HYDROCODONE-ACETAMINOPHEN 5-325 MG PO TABS: 1.0000 | ORAL_TABLET | Freq: Four times a day (QID) | ORAL | 0 refills | Status: AC | PRN

## 2019-04-11 MED ORDER — OXYCODONE-ACETAMINOPHEN 10-325 MG PO TABS: 1.0000 | ORAL_TABLET | Freq: Three times a day (TID) | ORAL | 0 refills | Status: AC | PRN

## 2019-04-11 NOTE — Telephone Encounter (Signed)
Received call from patient's wife. She states that he is in severe pain and has taken 4 hydrocodone tablets with no relief. They would like a call back to discuss options for pain control.

## 2019-04-11 NOTE — Progress Notes (Cosign Needed)
Rx sent to pharmacy for Norco. Patient's fiance called stating the pharmacy didn't have "3 of the 4 medications on their list, Ibuprofen, Percocet, or senokot" These look like old meds that were not removed from the patients med list and printed on discharge summary. System shows Dr. Arita Miss sent in Norco and Keflex.  Patient has Keflex.  Resent Norco to pharmacy as PDMP does not show Rx was picked up.

## 2019-04-11 NOTE — Anesthesia Postprocedure Evaluation (Signed)
Anesthesia Post Note  Patient: ABIR CRAINE  Procedure(s) Performed: IRRIGATION AND DEBRIDEMENT OF RIGHT LONG AND RING FINGERS . (Right Finger) PERCUTANEOUS PINNING RIGHT LONG AND RING FINGERS. (Right Finger)     Patient location during evaluation: PACU Anesthesia Type: General Level of consciousness: awake and alert Pain management: pain level controlled Vital Signs Assessment: post-procedure vital signs reviewed and stable Respiratory status: spontaneous breathing, nonlabored ventilation and respiratory function stable Cardiovascular status: blood pressure returned to baseline and stable Postop Assessment: no apparent nausea or vomiting Anesthetic complications: no    Last Vitals:  Vitals:   04/10/19 2149 04/10/19 2204  BP: (!) 161/86 (!) 167/69  Pulse: 71 80  Resp: 13 14  Temp:  36.6 C  SpO2: 95% 97%    Last Pain:  Vitals:   04/10/19 2204  TempSrc:   PainSc: 3                  Beryle Lathe

## 2019-04-13 ENCOUNTER — Encounter: Payer: Self-pay | Admitting: *Deleted

## 2019-04-24 ENCOUNTER — Encounter: Payer: Self-pay | Admitting: Plastic Surgery

## 2019-04-24 ENCOUNTER — Other Ambulatory Visit: Payer: Self-pay

## 2019-04-24 ENCOUNTER — Ambulatory Visit (INDEPENDENT_AMBULATORY_CARE_PROVIDER_SITE_OTHER): Payer: Worker's Compensation | Admitting: Plastic Surgery

## 2019-04-24 VITALS — BP 148/100 | HR 82 | Temp 98.4°F | Ht 76.0 in | Wt 287.0 lb

## 2019-04-24 DIAGNOSIS — S62622B Displaced fracture of medial phalanx of right middle finger, initial encounter for open fracture: Secondary | ICD-10-CM

## 2019-04-24 NOTE — Progress Notes (Signed)
Patient presents postop from a long finger amputation and open reduction internal fixation of the ring finger middle phalanx.  The ring finger also required repair of one of the digital nerves and the flexor and extensor tendons.  He is in his operative splint and has yet to go to therapy due to scheduling mixup.  On exam the pins are in place coming out distally in the ring finger and there is no signs of a pin tract infection.  He has sensation in that fingertip.  He can pull the dressing aside and see the wound on his long finger and its looking as expected.  I am going to coordinate with our office to ensure that he has an appointment with hand therapy to have a new splint made.  I am also going to order an x-ray for him of the right hand to check the positioning of the pins in the middle phalanx fracture.  I plan to see him again in 2 weeks but have instructed him to call if there is any surprises or concerns.

## 2019-04-27 ENCOUNTER — Emergency Department (HOSPITAL_COMMUNITY)
Admission: EM | Admit: 2019-04-27 | Discharge: 2019-04-28 | Disposition: A | Discharge status: Home / Self Care | Payer: Worker's Compensation | Attending: Emergency Medicine | Admitting: Emergency Medicine

## 2019-04-27 ENCOUNTER — Other Ambulatory Visit: Payer: Self-pay

## 2019-04-27 DIAGNOSIS — G8918 Other acute postprocedural pain: Secondary | ICD-10-CM

## 2019-04-27 DIAGNOSIS — F17210 Nicotine dependence, cigarettes, uncomplicated: Secondary | ICD-10-CM | POA: Diagnosis not present

## 2019-04-27 DIAGNOSIS — Y990 Civilian activity done for income or pay: Secondary | ICD-10-CM | POA: Insufficient documentation

## 2019-04-27 DIAGNOSIS — Y9289 Other specified places as the place of occurrence of the external cause: Secondary | ICD-10-CM | POA: Insufficient documentation

## 2019-04-27 DIAGNOSIS — Y939 Activity, unspecified: Secondary | ICD-10-CM | POA: Diagnosis not present

## 2019-04-27 DIAGNOSIS — S68112D Complete traumatic metacarpophalangeal amputation of right middle finger, subsequent encounter: Secondary | ICD-10-CM | POA: Diagnosis present

## 2019-04-27 DIAGNOSIS — Z9889 Other specified postprocedural states: Secondary | ICD-10-CM | POA: Insufficient documentation

## 2019-04-27 DIAGNOSIS — I10 Essential (primary) hypertension: Secondary | ICD-10-CM | POA: Insufficient documentation

## 2019-04-27 DIAGNOSIS — X58XXXD Exposure to other specified factors, subsequent encounter: Secondary | ICD-10-CM | POA: Insufficient documentation

## 2019-04-27 DIAGNOSIS — S68124D Partial traumatic metacarpophalangeal amputation of right ring finger, subsequent encounter: Secondary | ICD-10-CM | POA: Insufficient documentation

## 2019-04-27 MED ORDER — HYDROMORPHONE HCL 1 MG/ML IJ SOLN
1.0000 mg | Freq: Once | INTRAMUSCULAR | Status: AC
  Administered 2019-04-27: 1 mg via INTRAMUSCULAR
  Filled 2019-04-27: qty 1

## 2019-04-27 NOTE — ED Triage Notes (Signed)
Pt seen Dr. Arita Miss 04-24-19.  Pt was able to move fingers and arm but 2 days ago is not able to move arm at wrist or fingers and it is numb.

## 2019-04-27 NOTE — ED Provider Notes (Signed)
[facility] EMERGENCY DEPARTMENT Provider Note   CSN: [id number] Arrival date & time: 04/27/19  1756     History Chief Complaint  Patient presents with  . Post-op Problem    Marc Hebert is a 29 y.o. male.  The history is provided by the patient and medical records.    29 y.o. M with hx of HTN, presenting to the ED for right hand numbness.  Patient had a work related accident on 04/10/19 in which distal portion of his right middle finger and right ring finger were severely injured.  He underwent revision amputation and repair with Dr. Silverio Lay that same day.  States he has been doing well postoperatively, but having a great deal of pain.  He was already switched from hydrocodone to oxycodone.  He had a follow-up appointment on 04/24/2019 in the office and seem to be doing okay, however there was a mixup with scheduling of his therapy so that has been delayed.  States he has been in the same splint since 04/10/2019, was supposed to be placed into a removable splint by now.  Patient states this morning he started feeling numbness in his hand and feeling like he could not move his wrist, hand, or fingers which she was previously able to do.  States it feels "locked up".  He denies any increase in his pain from baseline.  He has not noticed any drainage from any of his incisions.  No fever/chills.  Past Medical History:  Diagnosis Date  . Hypertension     Patient Active Problem List   Diagnosis Date Noted  . Soft tissue swelling   . Dental infection 05/31/2016    Past Surgical History:  Procedure Laterality Date  . I & D EXTREMITY Right 04/10/2019   Procedure: IRRIGATION AND DEBRIDEMENT OF RIGHT LONG AND RING FINGERS .;  Surgeon: Cindra Presume, MD;  Location: [facility];  Service: Plastics;  Laterality: Right;  . PERCUTANEOUS PINNING Right 04/10/2019   Procedure: PERCUTANEOUS PINNING RIGHT LONG AND RING FINGERS.;  Surgeon: Cindra Presume, MD;  Location: [facility];   Service: Plastics;  Laterality: Right;       Family History  Problem Relation Age of Onset  . Hypertension Mother   . Hypertension Father   . Hypertension Brother   . Hypertension Other     Social History   Tobacco Use  . Smoking status: Current Every Day Smoker    Packs/day: 0.50    Types: Cigarettes  . Smokeless tobacco: Current User    Types: Chew  Substance Use Topics  . Alcohol use: Yes    Comment: 2x week  . Drug use: No    Home Medications Prior to Admission medications   Medication Sig Start Date End Date Taking? Authorizing Provider  ibuprofen (ADVIL,MOTRIN) 800 MG tablet Take 1 tablet (800 mg total) by mouth every 8 (eight) hours as needed. Patient not taking: Reported on 04/10/2019 06/02/16   Alphonzo Grieve, MD  oxyCODONE-acetaminophen (PERCOCET) 10-325 MG tablet Take 0.5-1 tablets by mouth every 8 (eight) hours as needed for pain. Patient not taking: Reported on 04/10/2019 06/02/16   Alphonzo Grieve, MD  senna-docusate (SENOKOT-S) 8.6-50 MG tablet Take 1 tablet by mouth at bedtime. Patient not taking: Reported on 04/10/2019 06/02/16   Alphonzo Grieve, MD    Allergies    Patient has no known allergies.  Review of Systems   Review of Systems  Musculoskeletal: Positive for arthralgias.  All other systems reviewed and are negative.  Physical Exam Updated Vital Signs BP (!) 167/86   Pulse (!) 110   Temp 98.3 F (36.8 C) (Oral)   Resp 18   Ht 6\' 4"  (1.93 m)   Wt 130.2 kg   SpO2 98%   BMI 34.93 kg/m   Physical Exam Vitals and nursing note reviewed.  Constitutional:      Appearance: He is well-developed.  HENT:     Head: Normocephalic and atraumatic.  Eyes:     Conjunctiva/sclera: Conjunctivae normal.     Pupils: Pupils are equal, round, and reactive to light.  Cardiovascular:     Rate and Rhythm: Normal rate and regular rhythm.     Heart sounds: Normal heart sounds.  Pulmonary:     Effort: Pulmonary effort is normal.     Breath sounds: Normal  breath sounds.  Abdominal:     General: Bowel sounds are normal.     Palpations: Abdomen is soft.  Musculoskeletal:        General: Normal range of motion.     Cervical back: Normal range of motion.     Comments: Right hand in splint, pin visible at distal tip of ring finger Splint removed-- skin appears to have normal integrity without breakdown or evidence of cellulitis, incisions actually appear clean and remain well approximated; there is no significant swelling, compartments remain soft and compressible; radial pulse is intact, normal sensation distally to all remaining fingertips, normal perfusion and cap refill distally  Skin:    General: Skin is warm and dry.  Neurological:     Mental Status: He is alert and oriented to person, place, and time.         ED Results / Procedures / Treatments   Labs (all labs ordered are listed, but only abnormal results are displayed) Labs Reviewed - No data to display  EKG None  Radiology No results found.  Procedures Procedures (including critical care time)  Medications Ordered in ED Medications  HYDROmorphone (DILAUDID) injection 1 mg (has no administration in time range)  HYDROmorphone (DILAUDID) injection 1 mg (1 mg Intramuscular Given 04/27/19 2332)    ED Course  I have reviewed the triage vital signs and the nursing notes.  Pertinent labs & imaging results that were available during my care of the patient were reviewed by me and considered in my medical decision making (see chart for details).    MDM Rules/Calculators/A&P  30 year old male presenting to the ED with postop problem.  He sustained a work-related injury to right hand on 04/10/2019, went to the OR same day with Dr. 04/12/2019.  He did have amputation revision of right middle finger and extensive repair of right ring finger.  He does have pins in place.  States since this morning he feels like he cannot move his fingers or his wrist which she was able to do a few days  ago.  His physical therapy has unfortunately been delayed due to mixup in the office but is due to start later this week.  He has not had any fever or any other infectious symptoms.  Will remove splint here and fully assess hand.  11:41 PM Majority of splint was removed, however xeroform and piece of 4x4 gauze directly on top of that are strongly adhered to the skin on the 3rd finger revision amputation and unable to be removed.  Will soak to see if can be removed then.  Even after splint was removed, patient states he still cannot move his fingers, however I did  observe some movement of the right index finger.  He does report normal sensation distally to all of the finger tips, they do appear to have good perfusion, no duskiness.  Pin in 4th finger appears to have normal positioning.  He reports pain from distal forearm up through hand but there is no significant swelling, radial pulse remains intact.  Clinically this is not concerning for compartment syndrome.  12:03 AM Remainder of xeroform and 4x4 gauze was removed, patient was able to wiggle each of his unaffected fingers.  He maintains normal distal sensation to all of the fingers.  Incisions were examined, remain well approximated and overall clean.  12:37 AM Patients movement in the fingers continues to improve out of splint.  Ortho tech has gone back into room to try to reapply splint and patient is very unhappy about this.  We have both had lengthy discussion with him that it is imperative to remain in splint to allow his repairs to fully heal to prevent further damage, infection, etc.  He has FU appt later this week to start therapy, ideally will be placed into removable splint at that time.  He eventually agreed to replacement splint after being assured proper padding to help with better comfort than first splint.  He and wife will call Dr. Thomos Lemons office in the morning if ongoing concerns.  1:33 AM Splint has been replaced.  Patient appears  comfortable.  He is concerned about managing his pain at home, states oxycodone is not working.  Discussed that I am happy to manage pain in the ER, however as an OP this will need to be managed by his surgeon.   Additional dose of IM dilaudid ordered here.  He will call office in the morning to discuss ER visit and pain management going forward.  He may return here for any new/acute changes.  Final Clinical Impression(s) / ED Diagnoses Final diagnoses:  Post-operative state  Post-operative pain    Rx / DC Orders ED Discharge Orders    None       Garlon Hatchet, PA-C 04/28/19 0214    Dione Booze, MD 04/28/19 [phone]

## 2019-04-28 ENCOUNTER — Telehealth: Payer: Self-pay | Admitting: Plastic Surgery

## 2019-04-28 ENCOUNTER — Other Ambulatory Visit: Payer: Self-pay | Admitting: Plastic Surgery

## 2019-04-28 MED ORDER — OXYCODONE-ACETAMINOPHEN 10-325 MG PO TABS: 1.0000 | ORAL_TABLET | Freq: Three times a day (TID) | ORAL | 0 refills | Status: AC | PRN

## 2019-04-28 MED ORDER — HYDROMORPHONE HCL 1 MG/ML IJ SOLN
1.0000 mg | Freq: Once | INTRAMUSCULAR | Status: AC
  Administered 2019-04-28: 1 mg via INTRAMUSCULAR
  Filled 2019-04-28: qty 1

## 2019-04-28 NOTE — ED Notes (Signed)
Patient verbalizes understanding of discharge instructions. Opportunity for questioning and answers were provided. Armband removed by staff, pt discharged from ED ambulatory.   

## 2019-04-28 NOTE — Discharge Instructions (Signed)
I would call Dr. Arita Miss in the morning and notify of this ER visit and any additional concerns. Return here for any new/acute changes.

## 2019-04-28 NOTE — Progress Notes (Signed)
Orthopedic Tech Progress Note Patient Details:  Marc Hebert 10-16-1990 [id number]  Ortho Devices Type of Ortho Device: Arm sling, Volar splint Ortho Device/Splint Location: rue. i assisted with splint removal. then i put a dressing back on the fingers and replace the splint with a long volar splint. Ortho Device/Splint Interventions: Ordered, Application, Adjustment   Post Interventions Patient Tolerated: Well Instructions Provided: Care of device, Adjustment of device   Trinna Post 04/28/2019, 1:15 AM

## 2019-04-28 NOTE — Telephone Encounter (Signed)
Pt lvm regarding therapy referral. I called pt back to advise of phone number to therapy office to schedule appt directly. During the call, he asked for pain medication for his hand. Please call patient to advise if Dr. Arita Miss will prescribe pain meds or what he should do.

## 2019-04-29 NOTE — Telephone Encounter (Signed)
Returned patients call. LM on VM Dr. Arita Miss sent in refill of oxycodone-acetaminophen 10-325

## 2019-04-30 ENCOUNTER — Ambulatory Visit: Payer: Worker's Compensation | Attending: Plastic Surgery | Admitting: Occupational Therapy

## 2019-04-30 ENCOUNTER — Other Ambulatory Visit: Payer: Self-pay

## 2019-04-30 ENCOUNTER — Encounter: Payer: Self-pay | Admitting: Occupational Therapy

## 2019-04-30 DIAGNOSIS — R600 Localized edema: Secondary | ICD-10-CM | POA: Diagnosis present

## 2019-04-30 DIAGNOSIS — R278 Other lack of coordination: Secondary | ICD-10-CM | POA: Diagnosis present

## 2019-04-30 DIAGNOSIS — M25641 Stiffness of right hand, not elsewhere classified: Secondary | ICD-10-CM | POA: Diagnosis present

## 2019-04-30 DIAGNOSIS — M6281 Muscle weakness (generalized): Secondary | ICD-10-CM | POA: Diagnosis present

## 2019-04-30 DIAGNOSIS — M79641 Pain in right hand: Secondary | ICD-10-CM | POA: Diagnosis not present

## 2019-04-30 NOTE — Therapy (Signed)
[facility] [address] [place], [place], [address] Phone: [phone]   Fax:  [phone]  Occupational Therapy Evaluation  Patient Details  Name: Marc Hebert MRN: [id number] Date of Birth: November 28, 1990 Referring Provider (OT): Dr. Arita Miss   Encounter Date: 04/30/2019  OT End of Session - 04/30/19 1221    Visit Number  1    Number of Visits  25    Date for OT Re-Evaluation  07/29/19    Authorization Type  worker's comp    OT Start Time  1020    OT Stop Time  1200    OT Time Calculation (min)  100 min       Past Medical History:  Diagnosis Date  . Hypertension     Past Surgical History:  Procedure Laterality Date  . I & D EXTREMITY Right 04/10/2019   Procedure: IRRIGATION AND DEBRIDEMENT OF RIGHT LONG AND RING FINGERS .;  Surgeon: Allena Napoleon, MD;  Location: [facility];  Service: Plastics;  Laterality: Right;  . PERCUTANEOUS PINNING Right 04/10/2019   Procedure: PERCUTANEOUS PINNING RIGHT LONG AND RING FINGERS.;  Surgeon: Allena Napoleon, MD;  Location: [facility] OR;  Service: Plastics;  Laterality: Right;    There were no vitals filed for this visit.  Subjective Assessment - 04/30/19 1346    Subjective   Pt s/p crush injury to right hand while at work    Pertinent History  29 y.o. male s/p  severe crush injury that occurred at work.  A metal beam fell on his right ring and long fingers.  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  The ring finger crush was through the level of the middle phalanx and was a near amputation.  Pt underwent I and D right and long fingers, Open reduction internal fixation of right ring finger middle phalanx fracture, Repair of right ring finger FDP using a slip of the FDS as a tendon graft,  Repair of right ring finger extensor tendon, Reconstruction of right ring finger radial digital nerve with nerve tube, Revision amputation of right long finger    Patient Stated Goals   regain use of RUE    Currently in Pain?  Yes    Pain Score  6     Pain Location  Hand    Pain Orientation  Right    Pain Descriptors / Indicators  Aching    Pain Type  Acute pain    Pain Onset  1 to 4 weeks ago    Pain Frequency  Constant    Aggravating Factors   malpositioning    Pain Relieving Factors  repositioning        [facility] OT Assessment - 04/30/19 0001      Assessment   Medical Diagnosis  crush injury to RUE with 3rd digit amputation, flexor and extensor tendon repairs and middle phalanx fx to ring finger s/p pinning    Referring Provider (OT)  Dr. Arita Miss    Onset Date/Surgical Date  04/10/19    Hand Dominance  Right      Precautions   Precautions  Other (comment)    Precaution Comments  splint at al times except for hand hygeine and dressing changes      Home  Environment   Family/patient expects to be discharged to:  Private residence    Lives With  Significant other      Prior Function   Level of Independence  Independent  Vocation  Full time employment    Vocation Requirements  lifting, use of heavy equipment      ADL   ADL comments  Pt is performing primarily with LUE due to RUE injury, unable to use RUE for heavier tasks, plans to return to work light duty without using RUE     Written Expression   Dominant Hand  Right      Coordination   Gross Motor Movements are Fluid and Coordinated  Not tested    Fine Motor Movements are Fluid and Coordinated  Not tested      Edema   Edema  moderate edema in right hand, pins in place at finger tip ring fingeer with an open wound on vilar aspect of ring finger. Open wound present at middle finger stum. No signs and symptoms of infection present.      ROM / Strength   AROM / PROM / Strength  AROM      AROM   Overall AROM   Unable to assess;Due to precautions      Hand Function   Right Hand Grip (lbs)  --   Not tested due to precautions.              OT Treatments/Exercises (OP) - 04/30/19 0001       Splinting   Splinting  Pt arrived wearing splint applied at ED . Splint was carefully removed and pt's dressing was removed using saline to assist with removal of xeroform as it had adhered to open  wounds. Pt's hand was cleaned with soap and water avoiding open areas. Therapist cleaned ring and middle fingers with saline avoiding wounds. Piin site was cleaned with saline Pt's middle and ring fingers were dressed with xerofrom over open areas including stump of middle finger. Xerofrom was topped with 2 x 2 then gauze wrap.. Pt's was provided with stockinette for right hand / forearm. Pt was fitted with a forearm based splint including digits 3-5 with MP's and wrist in slight flexion. Protective strap over pins.  Pt was educated in splint wear, care and preacutions as welll as dressing changes. Pt verbalized understanding           OT Education - 04/30/19 1754    Education Details  splint wear, care and precautions, daily dressing changes/ pin site care.    Person(s) Educated  Patient    Methods  Explanation;Demonstration;Verbal cues;Handout    Comprehension  Verbalized understanding;Returned demonstration       OT Short Term Goals - 04/30/19 1747      OT SHORT TERM GOAL #1   Title  I with inital  HEP(once cleared by MD for A/ROM.    Time  6    Period  Weeks    Status  New    Target Date  06/14/19      OT SHORT TERM GOAL #2   Title  I with splint wear, care and precautions    Time  6    Period  Weeks    Status  New      OT SHORT TERM GOAL #3   Title  I with dressing changes/ hand hygiene    Time  6    Period  Weeks    Status  New      OT SHORT TERM GOAL #4   Title  Pt will demonstrate at least 50% composite finger flexion for RUE.    Time  6    Period  Weeks  Status  New        OT Long Term Goals - 04/30/19 1749      OT LONG TERM GOAL #1   Title  Pt will demonstrate understanding of updated HEP.    Time  12    Period  Weeks    Status  New    Target Date   07/29/19      OT LONG TERM GOAL #2   Title  Pt will resume use of RUE as a dominant hand at least 90% of the time with pain less than or equal to 3/10    Time  12    Period  Weeks    Status  New      OT LONG TERM GOAL #3   Title  Pt will demonstrate  grip strength of at least 30 lbs for RUE functional use.    Time  12    Period  Weeks    Status  New      OT LONG TERM GOAL #4   Title  Pt will demonstrate at least 90% composite finger flexion for RUE.    Time  12    Period  Weeks    Status  New      OT LONG TERM GOAL #5   Title  Pt will perfrom simulated work activities modified independently    Time  12    Period  Weeks    Status  New            Plan - 04/30/19 1354    Clinical Impression Statement  29 y.o. male s/p  severe crush injury that occurred at work.  A metal beam fell on his right ring and long fingers.  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  The ring finger crush was through the level of the middle phalanx and was a near amputation.  Pt underwent I and D right and long fingers, Open reduction internal fixation of right ring finger middle phalanx fracture, Repair of right ring finger FDP using a slip of the FDS as a tendon graft,  Repair of right ring finger extensor tendon, Reconstruction of right ring finger radial digital nerve with nerve tube, Revision amputation of right long finger. Pt presents to occupational therapy with the following deficits: decreased ROM, pain, decreased strength, decreased RUE functional use, open wounds and edema which impede performance of ADLs/IADLs. Pt can benefit from skilled occupational therapy to maximize pt's safety and I with daily activities.    OT Occupational Profile and History  Detailed Assessment- Review of Records and additional review of physical, cognitive, psychosocial history related to current functional performance    Occupational performance deficits (Please refer to evaluation for details):   ADL's;IADL's;Rest and Sleep;Work;Play;Leisure;Social Participation    Body Structure / Function / Physical Skills  ADL;Edema;Skin integrity;Strength;UE functional use;Flexibility;FMC;Pain;Proprioception;Coordination;Decreased knowledge of precautions;GMC;ROM;Wound;Scar mobility;Dexterity;IADL;Sensation    Rehab Potential  Good    Clinical Decision Making  Limited treatment options, no task modification necessary    Comorbidities Affecting Occupational Performance:  May have comorbidities impacting occupational performance    Modification or Assistance to Complete Evaluation   No modification of tasks or assist necessary to complete eval    OT Frequency  2x / week   plus eval, will likely see pt 1x week until he is cleared for ROM.   OT Duration  12 weeks    OT Treatment/Interventions  Self-care/ADL training;Therapeutic exercise;Splinting;Manual Therapy;Neuromuscular education;Ultrasound;Therapeutic activities;DME and/or AE instruction;Paraffin;Cryotherapy;Electrical Stimulation;Fluidtherapy;Scar mobilization;Patient/family  education;Passive range of motion;Contrast Bath;Moist Heat    Plan  splint check, dressing changes, begin ROM when cleared by MD    Consulted and Agree with Plan of Care  Patient       Patient will benefit from skilled therapeutic intervention in order to improve the following deficits and impairments:   Body Structure / Function / Physical Skills: ADL, Edema, Skin integrity, Strength, UE functional use, Flexibility, FMC, Pain, Proprioception, Coordination, Decreased knowledge of precautions, GMC, ROM, Wound, Scar mobility, Dexterity, IADL, Sensation       Visit Diagnosis: Pain in right hand - Plan: Ot plan of care cert/re-cert  Other lack of coordination - Plan: Ot plan of care cert/re-cert  Stiffness of right hand, not elsewhere classified - Plan: Ot plan of care cert/re-cert  Muscle weakness (generalized) - Plan: Ot plan of care cert/re-cert  Localized edema -  Plan: Ot plan of care cert/re-cert    Problem List Patient Active Problem List   Diagnosis Date Noted  . Soft tissue swelling   . Dental infection 05/31/2016    Asif Muchow 04/30/2019, 6:05 PM Keene Breath, OTR/L [phone] Phone: [phone] 6:05 PM 04/30/19 [facility] [address] [place], [place], [address] Phone: [phone]   Fax:  [phone]  Name: MIKKO LEWELLEN MRN: [id number] Date of Birth: 1990/11/24

## 2019-04-30 NOTE — Patient Instructions (Signed)
WEARING SCHEDULE:  Wear splint at ALL times except for hygiene care  PURPOSE:  To prevent movement and for protection until injury can heal  CARE OF SPLINT:  Keep splint away from heat sources including: stove, radiator or furnace, or a car in sunlight. The splint can melt and will no longer fit you properly  Keep away from pets and children  Clean the splint with rubbing alcohol 1-2 times per day.  * During this time, make sure you also clean your hand/arm as instructed by your therapist and/or perform dressing changes as needed. Then dry hand/arm completely before replacing splint. (When cleaning hand/arm, keep it immobilized in same position until splint is replaced)  PRECAUTIONS/POTENTIAL PROBLEMS: *If you notice or experience increased pain, swelling, numbness, or a lingering reddened area from the splint: Contact your therapist immediately by calling [phone]. You must wear the splint for protection, but we will get you scheduled for adjustments as quickly as possible.  (If only straps or hooks need to be replaced and NO adjustments to the splint need to be made, just call the office ahead and let them know you are coming in)  If you have any medical concerns or signs of infection, please call your doctor immediately  Please clean your arm and hand with soap and water, Always dry thoroughly. You can clean around open wounds with saline if drainage, clean pins with saline and q-tip daily.. Dress open wounds with xeroform and 2 x 2 and top with gauze wrap. Try not to move your hand and wrist while splint is off.

## 2019-05-05 ENCOUNTER — Ambulatory Visit
Admission: RE | Admit: 2019-05-05 | Discharge: 2019-05-05 | Disposition: A | Discharge status: Home / Self Care | Payer: Self-pay | Source: Ambulatory Visit | Attending: Plastic Surgery | Admitting: Plastic Surgery

## 2019-05-05 DIAGNOSIS — S62622B Displaced fracture of medial phalanx of right middle finger, initial encounter for open fracture: Secondary | ICD-10-CM

## 2019-05-08 ENCOUNTER — Ambulatory Visit (INDEPENDENT_AMBULATORY_CARE_PROVIDER_SITE_OTHER): Payer: Worker's Compensation | Admitting: Plastic Surgery

## 2019-05-08 ENCOUNTER — Telehealth: Payer: Self-pay | Admitting: *Deleted

## 2019-05-08 ENCOUNTER — Encounter: Payer: Self-pay | Admitting: Plastic Surgery

## 2019-05-08 ENCOUNTER — Other Ambulatory Visit: Payer: Self-pay

## 2019-05-08 VITALS — BP 163/94 | HR 81 | Temp 98.2°F | Ht 76.0 in | Wt 287.0 lb

## 2019-05-08 DIAGNOSIS — S62622B Displaced fracture of medial phalanx of right middle finger, initial encounter for open fracture: Secondary | ICD-10-CM

## 2019-05-08 MED ORDER — CELECOXIB 200 MG PO CAPS: 200.0000 mg | ORAL_CAPSULE | Freq: Two times a day (BID) | ORAL | 1 refills | Status: DC

## 2019-05-08 NOTE — Addendum Note (Signed)
Addended by: Allena Napoleon on: 05/08/2019 01:28 PM   Modules accepted: Orders

## 2019-05-08 NOTE — Progress Notes (Signed)
Patient is here 3 weeks postop from an open reduction internal fixation of the ring finger along with multiple tendon repairs and nerve reconstruction and revision amputation of the long finger.  He has been to therapy and got a replaceable splint.  Overall he is doing well.  He complains of some pain at the nailbed of the ring finger but otherwise seems to be doing okay.  On exam he has some scabbing at the distal aspect of the amputated long finger stump.  There is no exposed bone or signs of infection.  The ring finger looks to be in appropriate axial alignment with some scabbing along the incisions volarly.  He does have sensation in the ring finger distally.  The pin sites are coming out through the distal tip with no signs of a pin tract infection.  X-ray was reviewed and shows appropriate alignment of a heavily comminuted middle phalanx fracture of the ring finger.  There is multiple bony fragments but the overall alignment looks great.  We will plan to leave the pins in for another 3 weeks if possible.  I will have him return at that time with another x-ray.  In the interim he is free to move his thumb and index finger in therapy if he continues to go.  I explained to him he may require a second amputation of the long finger stump taking it back to the level of the PIP joint but we will see if this is able to heal up on its own.  In regards to the ring finger he had a heavily comminuted fracture of the middle phalanx and I would like to leave it stabilize for as long as possible with the hope that union occurs.  He is fully understanding and will provide a work note for him and also assist him with wound care supplies.

## 2019-05-08 NOTE — Telephone Encounter (Signed)
Faxed ordered to [facility] for supplies for the patient.  Confirmation received and and copy scanned into the chart.//AB/CMA

## 2019-05-09 ENCOUNTER — Encounter: Payer: Self-pay | Admitting: Occupational Therapy

## 2019-05-09 ENCOUNTER — Ambulatory Visit: Payer: Worker's Compensation | Attending: Plastic Surgery | Admitting: Occupational Therapy

## 2019-05-09 DIAGNOSIS — M6281 Muscle weakness (generalized): Secondary | ICD-10-CM | POA: Diagnosis present

## 2019-05-09 DIAGNOSIS — R278 Other lack of coordination: Secondary | ICD-10-CM | POA: Diagnosis present

## 2019-05-09 DIAGNOSIS — M79641 Pain in right hand: Secondary | ICD-10-CM | POA: Insufficient documentation

## 2019-05-09 DIAGNOSIS — M25641 Stiffness of right hand, not elsewhere classified: Secondary | ICD-10-CM | POA: Diagnosis present

## 2019-05-09 NOTE — Therapy (Signed)
[facility] [address] [place], [place], [address] Phone: [phone]   Fax:  [phone]  Occupational Therapy Treatment  Patient Details  Name: DELAN KSIAZEK MRN: [id number] Date of Birth: 06-03-1990 Referring Provider (OT): Dr. Claudia Desanctis   Encounter Date: 05/09/2019  OT End of Session - 05/09/19 1351    Visit Number  2    Number of Visits  25    Date for OT Re-Evaluation  07/29/19    Authorization Type  worker's comp    OT Start Time  1152    OT Stop Time  1220    OT Time Calculation (min)  28 min       Past Medical History:  Diagnosis Date  . Hypertension     Past Surgical History:  Procedure Laterality Date  . I & D EXTREMITY Right 04/10/2019   Procedure: IRRIGATION AND DEBRIDEMENT OF RIGHT LONG AND RING FINGERS .;  Surgeon: Cindra Presume, MD;  Location: [facility];  Service: Plastics;  Laterality: Right;  . PERCUTANEOUS PINNING Right 04/10/2019   Procedure: PERCUTANEOUS PINNING RIGHT LONG AND RING FINGERS.;  Surgeon: Cindra Presume, MD;  Location: [facility];  Service: Plastics;  Laterality: Right;    There were no vitals filed for this visit.  Subjective Assessment - 05/09/19 1244    Subjective   Pt s/p crush injury to right hand while at work    Pertinent History  29 y.o. male s/p  severe crush injury that occurred at work.  A metal beam fell on his right ring and long fingers.  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  The ring finger crush was through the level of the middle phalanx and was a near amputation.  Pt underwent I and D right and long fingers, Open reduction internal fixation of right ring finger middle phalanx fracture, Repair of right ring finger FDP using a slip of the FDS as a tendon graft,  Repair of right ring finger extensor tendon, Reconstruction of right ring finger radial digital nerve with nerve tube, Revision amputation of right long finger    Patient Stated Goals   regain use of RUE    Currently in Pain?  Yes    Pain Score  5     Pain Location  --   fingernail   Pain Orientation  Right    Pain Descriptors / Indicators  Aching    Pain Type  Acute pain    Pain Onset  1 to 4 weeks ago    Pain Frequency  Intermittent    Aggravating Factors   unknown    Pain Relieving Factors  unknown             Treatment: Pt arrived wearing splint. Hand was cleaned around incisions and open ares with saline.  Xerofrom applied to middle finger sump and ring finger open areas. Stump was covered with gauze pad and then wrap. Pt's ring finger was wrapped with gauze. No s/s of infection present. Pins present at fingertips. Minor adjustments to thumb portion at splint with pt reporting increased comfort.  Therapist reminded pt to keep his hand dry and to wear splint when not performing hygiene. Pt verbalized understanding.                OT Short Term Goals - 04/30/19 1747      OT SHORT TERM GOAL #1   Title  I with inital  HEP(once cleared by MD  for A/ROM.    Time  6    Period  Weeks    Status  New    Target Date  06/14/19      OT SHORT TERM GOAL #2   Title  I with splint wear, care and precautions    Time  6    Period  Weeks    Status  New      OT SHORT TERM GOAL #3   Title  I with dressing changes/ hand hygiene    Time  6    Period  Weeks    Status  New      OT SHORT TERM GOAL #4   Title  Pt will demonstrate at least 50% composite finger flexion for RUE.    Time  6    Period  Weeks    Status  New        OT Long Term Goals - 04/30/19 1749      OT LONG TERM GOAL #1   Title  Pt will demonstrate understanding of updated HEP.    Time  12    Period  Weeks    Status  New    Target Date  07/29/19      OT LONG TERM GOAL #2   Title  Pt will resume use of RUE as a dominant hand at least 90% of the time with pain less than or equal to 3/10    Time  12    Period  Weeks    Status  New      OT LONG TERM GOAL #3   Title  Pt will  demonstrate  grip strength of at least 30 lbs for RUE functional use.    Time  12    Period  Weeks    Status  New      OT LONG TERM GOAL #4   Title  Pt will demonstrate at least 90% composite finger flexion for RUE.    Time  12    Period  Weeks    Status  New      OT LONG TERM GOAL #5   Title  Pt will perfrom simulated work activities modified independently    Time  12    Period  Weeks    Status  New            Plan - 05/09/19 1352    Clinical Impression Statement  Pt arrived wearing protective splint and dressings. Therapist changes pt's dressings and perfromed minor adjustments to splint for increased comfort.    OT Occupational Profile and History  Detailed Assessment- Review of Records and additional review of physical, cognitive, psychosocial history related to current functional performance    Occupational performance deficits (Please refer to evaluation for details):  ADL's;IADL's;Rest and Sleep;Work;Play;Leisure;Social Participation    Body Structure / Function / Physical Skills  ADL;Edema;Skin integrity;Strength;UE functional use;Flexibility;FMC;Pain;Proprioception;Coordination;Decreased knowledge of precautions;GMC;ROM;Wound;Scar mobility;Dexterity;IADL;Sensation    Rehab Potential  Good    Clinical Decision Making  Limited treatment options, no task modification necessary    Comorbidities Affecting Occupational Performance:  May have comorbidities impacting occupational performance    Modification or Assistance to Complete Evaluation   No modification of tasks or assist necessary to complete eval    OT Frequency  2x / week   plus eval, will likely see pt 1x week until he is cleared for ROM.   OT Duration  12 weeks    OT Treatment/Interventions  Self-care/ADL training;Therapeutic exercise;Splinting;Manual Therapy;Neuromuscular education;Ultrasound;Therapeutic activities;DME and/or  AE instruction;Paraffin;Cryotherapy;Electrical Stimulation;Fluidtherapy;Scar  mobilization;Patient/family education;Passive range of motion;Contrast Bath;Moist Heat    Plan  splint check, dressing changes, begin ROM when cleared by MD    Consulted and Agree with Plan of Care  Patient       Patient will benefit from skilled therapeutic intervention in order to improve the following deficits and impairments:   Body Structure / Function / Physical Skills: ADL, Edema, Skin integrity, Strength, UE functional use, Flexibility, FMC, Pain, Proprioception, Coordination, Decreased knowledge of precautions, GMC, ROM, Wound, Scar mobility, Dexterity, IADL, Sensation       Visit Diagnosis: Pain in right hand  Other lack of coordination  Stiffness of right hand, not elsewhere classified  Muscle weakness (generalized)    Problem List Patient Active Problem List   Diagnosis Date Noted  . Soft tissue swelling   . Dental infection 05/31/2016    Marua Qin 05/09/2019, 1:54 PM  [facility] [address] [place], [place], [address] Phone: [phone]   Fax:  [phone]  Name: ANDRON MARRAZZO MRN: [id number] Date of Birth: Jun 11, 1990

## 2019-05-12 ENCOUNTER — Telehealth: Payer: Self-pay | Admitting: *Deleted

## 2019-05-12 NOTE — Telephone Encounter (Signed)
Received Urgent:Documentation Needed via of fax from [facility] [employer] on (05/08/19).  Requesting more information about the workers Therapist, sports to complete the authorization process for the requested supplies.    Called on (05/09/19) and spoke with the patient's wife and informed her of the request from [employer].  Asked her to give [employer] a call.  She verbalized understanding and agreed to give [employer] a call.//AB/CMA

## 2019-05-13 ENCOUNTER — Ambulatory Visit: Payer: Worker's Compensation | Admitting: Occupational Therapy

## 2019-05-20 ENCOUNTER — Telehealth: Payer: Self-pay

## 2019-05-20 NOTE — Telephone Encounter (Signed)
Called and (LMOM @ 4:30pm) asking Dolores Lory, Workers Therapist, sports to RTC regarding message below.//AB/CMA

## 2019-05-20 NOTE — Telephone Encounter (Signed)
Received call from Dolores Lory, workers Conservation officer, historic buildings. She states that Mr. Tench has not received his medical wrap. Please call Rosey Bath back with update on [phone].

## 2019-05-21 ENCOUNTER — Ambulatory Visit: Payer: Worker's Compensation | Attending: Plastic Surgery | Admitting: Occupational Therapy

## 2019-05-21 ENCOUNTER — Other Ambulatory Visit: Payer: Self-pay

## 2019-05-21 DIAGNOSIS — M25641 Stiffness of right hand, not elsewhere classified: Secondary | ICD-10-CM | POA: Diagnosis present

## 2019-05-21 DIAGNOSIS — R600 Localized edema: Secondary | ICD-10-CM | POA: Insufficient documentation

## 2019-05-21 DIAGNOSIS — M79641 Pain in right hand: Secondary | ICD-10-CM

## 2019-05-21 DIAGNOSIS — R278 Other lack of coordination: Secondary | ICD-10-CM | POA: Insufficient documentation

## 2019-05-21 NOTE — Telephone Encounter (Signed)
Called [employer] and spoke with Tori regarding the order for supplies for the patient.  She stated that she has reached out to the patient by phone to get the worker compensation information to process the order, but has not heard back from the patient.  She said the information that we sent does not have a name of who to contact.  I informed Tori that I have the name and number of the Workers Therapist, sports.  I gave her Dolores Lory name and number.  Delorise Jackson stated that she will give her a call.  Called patient's wife and LMOM informing her that I spoke with [employer] and has given them the Workers Therapist, sports name and number.  Informed her to give me a call back if she has any questions.//AB/CMA

## 2019-05-21 NOTE — Telephone Encounter (Signed)
Called and [facility] @ 10:04am) for Ms Marc Hebert, Workers Compensation Adjuster asking her to RTC regarding the message below.//AB/CMA

## 2019-05-22 ENCOUNTER — Telehealth: Payer: Self-pay | Admitting: *Deleted

## 2019-05-22 NOTE — Telephone Encounter (Signed)
Received letter via fax from [employer] requesting a copy of order sent to [facility] for medical supplies for the patient.    Copy of the order for medical supplies was faxed and confirmation received and copy scanned into the chart.//AB/CMA

## 2019-05-22 NOTE — Therapy (Signed)
[facility] [address] [place], [place], [address] Phone: [phone]   Fax:  [phone]  Occupational Therapy Treatment  Patient Details  Name: ZAKARIE STURDIVANT MRN: [id number] Date of Birth: May 11, 1990 Referring Provider (OT): Dr. Claudia Desanctis   Encounter Date: 05/21/2019  OT End of Session - 05/22/19 1707    Visit Number  3    Number of Visits  25    Date for OT Re-Evaluation  07/29/19    Authorization Type  worker's comp    Authorization - Visit Number  3    Authorization - Number of Visits  12    OT Start Time  1020    OT Stop Time  1055    OT Time Calculation (min)  35 min    Activity Tolerance  Patient tolerated treatment well       Past Medical History:  Diagnosis Date  . Hypertension     Past Surgical History:  Procedure Laterality Date  . I & D EXTREMITY Right 04/10/2019   Procedure: IRRIGATION AND DEBRIDEMENT OF RIGHT LONG AND RING FINGERS .;  Surgeon: Cindra Presume, MD;  Location: [facility];  Service: Plastics;  Laterality: Right;  . PERCUTANEOUS PINNING Right 04/10/2019   Procedure: PERCUTANEOUS PINNING RIGHT LONG AND RING FINGERS.;  Surgeon: Cindra Presume, MD;  Location: [facility];  Service: Plastics;  Laterality: Right;    There were no vitals filed for this visit.    Pt denies pain   Treatment: Pt arrived wearing splint. Hand was cleaned around incisions and open ares with soap and water avoiding incisions, pins were cleaned with saline and Q-tip Xerofrom applied to middle finger stump and ring finger open areas. Stump was covered with gauze pad and then wrap. Pt's ring finger was dressed with 2x 2, wrapped with gauze. No s/s of infection present. Pins present at fingertips.                   OT Short Term Goals - 04/30/19 1747      OT SHORT TERM GOAL #1   Title  I with inital  HEP(once cleared by MD for A/ROM.    Time  6    Period  Weeks    Status  New    Target Date   06/14/19      OT SHORT TERM GOAL #2   Title  I with splint wear, care and precautions    Time  6    Period  Weeks    Status  New      OT SHORT TERM GOAL #3   Title  I with dressing changes/ hand hygiene    Time  6    Period  Weeks    Status  New      OT SHORT TERM GOAL #4   Title  Pt will demonstrate at least 50% composite finger flexion for RUE.    Time  6    Period  Weeks    Status  New        OT Long Term Goals - 04/30/19 1749      OT LONG TERM GOAL #1   Title  Pt will demonstrate understanding of updated HEP.    Time  12    Period  Weeks    Status  New    Target Date  07/29/19      OT LONG TERM GOAL #2   Title  Pt will resume use of RUE as  a dominant hand at least 90% of the time with pain less than or equal to 3/10    Time  12    Period  Weeks    Status  New      OT LONG TERM GOAL #3   Title  Pt will demonstrate  grip strength of at least 30 lbs for RUE functional use.    Time  12    Period  Weeks    Status  New      OT LONG TERM GOAL #4   Title  Pt will demonstrate at least 90% composite finger flexion for RUE.    Time  12    Period  Weeks    Status  New      OT LONG TERM GOAL #5   Title  Pt will perfrom simulated work activities modified independently    Time  12    Period  Weeks    Status  New            Plan - 05/22/19 1709    Clinical Impression Statement  Pt arrived wearing protective splint and dressings. Therapist performed dressing changes to right hand. Pt appears to be healing well    OT Occupational Profile and History  Detailed Assessment- Review of Records and additional review of physical, cognitive, psychosocial history related to current functional performance    Occupational performance deficits (Please refer to evaluation for details):  ADL's;IADL's;Rest and Sleep;Work;Play;Leisure;Social Participation    Body Structure / Function / Physical Skills  ADL;Edema;Skin integrity;Strength;UE functional  use;Flexibility;FMC;Pain;Proprioception;Coordination;Decreased knowledge of precautions;GMC;ROM;Wound;Scar mobility;Dexterity;IADL;Sensation    Rehab Potential  Good    Clinical Decision Making  Limited treatment options, no task modification necessary    Comorbidities Affecting Occupational Performance:  May have comorbidities impacting occupational performance    Modification or Assistance to Complete Evaluation   No modification of tasks or assist necessary to complete eval    OT Frequency  2x / week   plus eval, will likely see pt 1x week until he is cleared for ROM.   OT Duration  12 weeks    OT Treatment/Interventions  Self-care/ADL training;Therapeutic exercise;Splinting;Manual Therapy;Neuromuscular education;Ultrasound;Therapeutic activities;DME and/or AE instruction;Paraffin;Cryotherapy;Electrical Stimulation;Fluidtherapy;Scar mobilization;Patient/family education;Passive range of motion;Contrast Bath;Moist Heat    Plan  splint check, dressing changes, begin ROM when cleared by MD    Consulted and Agree with Plan of Care  Patient       Patient will benefit from skilled therapeutic intervention in order to improve the following deficits and impairments:   Body Structure / Function / Physical Skills: ADL, Edema, Skin integrity, Strength, UE functional use, Flexibility, FMC, Pain, Proprioception, Coordination, Decreased knowledge of precautions, GMC, ROM, Wound, Scar mobility, Dexterity, IADL, Sensation       Visit Diagnosis: Pain in right hand  Other lack of coordination  Stiffness of right hand, not elsewhere classified  Localized edema    Problem List Patient Active Problem List   Diagnosis Date Noted  . Soft tissue swelling   . Dental infection 05/31/2016    Pride Gonzales 05/22/2019, 5:12 PM  [facility] [address] [place], [place], [address] Phone: [phone]   Fax:  [phone]  Name: DONTRAVIOUS CAMILLE MRN: [id number] Date of Birth: 1990/08/12

## 2019-05-27 ENCOUNTER — Encounter: Payer: Self-pay | Admitting: Occupational Therapy

## 2019-05-27 ENCOUNTER — Other Ambulatory Visit: Payer: Self-pay | Admitting: Surgical

## 2019-05-27 ENCOUNTER — Ambulatory Visit
Admission: RE | Admit: 2019-05-27 | Discharge: 2019-05-27 | Disposition: A | Discharge status: Home / Self Care | Payer: Worker's Compensation | Source: Ambulatory Visit | Attending: Surgical | Admitting: Surgical

## 2019-05-27 DIAGNOSIS — S62622B Displaced fracture of medial phalanx of right middle finger, initial encounter for open fracture: Secondary | ICD-10-CM

## 2019-05-27 NOTE — Progress Notes (Signed)
X ray of right hand prior to appointment.

## 2019-05-28 ENCOUNTER — Telehealth: Payer: Self-pay | Admitting: Plastic Surgery

## 2019-05-28 NOTE — Telephone Encounter (Signed)
Elyn Aquas w/c adjuster, wanted to get next appt for patient. Advised her of appt on 05/29/19

## 2019-05-29 ENCOUNTER — Other Ambulatory Visit: Payer: Self-pay

## 2019-05-29 ENCOUNTER — Encounter: Payer: Self-pay | Admitting: Plastic Surgery

## 2019-05-29 ENCOUNTER — Ambulatory Visit (INDEPENDENT_AMBULATORY_CARE_PROVIDER_SITE_OTHER): Payer: Worker's Compensation | Admitting: Plastic Surgery

## 2019-05-29 ENCOUNTER — Encounter: Payer: Self-pay | Admitting: Occupational Therapy

## 2019-05-29 VITALS — BP 176/100 | HR 93 | Temp 98.4°F | Ht 76.0 in | Wt 287.0 lb

## 2019-05-29 DIAGNOSIS — S62622B Displaced fracture of medial phalanx of right middle finger, initial encounter for open fracture: Secondary | ICD-10-CM

## 2019-05-29 NOTE — Progress Notes (Signed)
Patient presents about 6 weeks postop from an open reduction percutaneous pinning of a right ring finger middle phalanx fracture.  I also did reconstruction of the FDP using a slip of the FDS, extensor tendon repair, and radial digital nerve reconstruction.  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  He feels like he has been progressing fine and is here to hopefully get his pins out.  X-ray done in the last couple days shows maintained alignment of the heavily comminuted middle phalanx fracture.  On exam there is no signs of a pin tract infection and all of his incisions of healed quite well.  The pins were removed without issue today.  The middle phalanx felt stable after removal.  I Minna send him back to therapy to start working on range of motion.  He no longer needs a splint that crosses the wrist but I think he should continue to have 1 that is hand-based that supports his ring finger and small finger.  I cautioned him to proceed with gentle range of motion so as not to stress the heavily comminuted middle phalanx fracture too much.  I plan to see him again in about a month.

## 2019-06-03 ENCOUNTER — Ambulatory Visit: Payer: Worker's Compensation | Attending: Plastic Surgery | Admitting: Occupational Therapy

## 2019-06-03 ENCOUNTER — Other Ambulatory Visit: Payer: Self-pay

## 2019-06-03 DIAGNOSIS — R600 Localized edema: Secondary | ICD-10-CM | POA: Insufficient documentation

## 2019-06-03 DIAGNOSIS — M6281 Muscle weakness (generalized): Secondary | ICD-10-CM | POA: Diagnosis present

## 2019-06-03 DIAGNOSIS — M79641 Pain in right hand: Secondary | ICD-10-CM | POA: Insufficient documentation

## 2019-06-03 DIAGNOSIS — M25641 Stiffness of right hand, not elsewhere classified: Secondary | ICD-10-CM | POA: Diagnosis present

## 2019-06-03 DIAGNOSIS — R278 Other lack of coordination: Secondary | ICD-10-CM | POA: Insufficient documentation

## 2019-06-03 NOTE — Therapy (Signed)
[facility] [address] [place], [place], [address] Phone: [phone]   Fax:  [phone]  Occupational Therapy Treatment  Patient Details  Name: Marc Hebert MRN: [id number] Date of Birth: 08-06-1990 Referring Provider (OT): Dr. Claudia Desanctis   Encounter Date: 06/03/2019  OT End of Session - 06/03/19 1027    Visit Number  4    Number of Visits  25    Date for OT Re-Evaluation  07/29/19    Authorization Type  worker's comp    Authorization - Visit Number  4    Authorization - Number of Visits  12    OT Start Time  1020    OT Stop Time  1100    OT Time Calculation (min)  40 min    Activity Tolerance  Patient tolerated treatment well       Past Medical History:  Diagnosis Date  . Hypertension     Past Surgical History:  Procedure Laterality Date  . I & D EXTREMITY Right 04/10/2019   Procedure: IRRIGATION AND DEBRIDEMENT OF RIGHT LONG AND RING FINGERS .;  Surgeon: Cindra Presume, MD;  Location: [facility];  Service: Plastics;  Laterality: Right;  . PERCUTANEOUS PINNING Right 04/10/2019   Procedure: PERCUTANEOUS PINNING RIGHT LONG AND RING FINGERS.;  Surgeon: Cindra Presume, MD;  Location: [facility];  Service: Plastics;  Laterality: Right;    There were no vitals filed for this visit.  Subjective Assessment - 06/03/19 1026    Subjective   Denies pain    Pertinent History  29 y.o. male s/p  severe crush injury that occurred at work.  A metal beam fell on his right ring and long fingers.  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  The ring finger crush was through the level of the middle phalanx and was a near amputation.  Pt underwent I and D right and long fingers, Open reduction internal fixation of right ring finger middle phalanx fracture, Repair of right ring finger FDP using a slip of the FDS as a tendon graft,  Repair of right ring finger extensor tendon, Reconstruction of right ring finger radial  digital nerve with nerve tube, Revision amputation of right long finger    Patient Stated Goals  regain use of RUE    Currently in Pain?  No/denies    Pain Onset  1 to 4 weeks ago               Treatment:Hotpack applied x 10 mins to right hand followed by massage to ring finger and education regarding A/ROM HEP. (Pt's pins have been removed and he is cleared for A/ROM and very gentle P/ROM)            OT Education - 06/03/19 1208    Education Details  splint wear, care and precautions, scar massage and inital A/ROM HEP.    Person(s) Educated  Patient    Methods  Explanation;Demonstration;Verbal cues;Handout    Comprehension  Verbalized understanding;Returned demonstration;Verbal cues required       OT Short Term Goals - 04/30/19 1747      OT SHORT TERM GOAL #1   Title  I with inital  HEP(once cleared by MD for A/ROM.    Time  6    Period  Weeks    Status  New    Target Date  06/14/19      OT SHORT TERM GOAL #2   Title  I with  splint wear, care and precautions    Time  6    Period  Weeks    Status  New      OT SHORT TERM GOAL #3   Title  I with dressing changes/ hand hygiene    Time  6    Period  Weeks    Status  New      OT SHORT TERM GOAL #4   Title  Pt will demonstrate at least 50% composite finger flexion for RUE.    Time  6    Period  Weeks    Status  New        OT Long Term Goals - 04/30/19 1749      OT LONG TERM GOAL #1   Title  Pt will demonstrate understanding of updated HEP.    Time  12    Period  Weeks    Status  New    Target Date  07/29/19      OT LONG TERM GOAL #2   Title  Pt will resume use of RUE as a dominant hand at least 90% of the time with pain less than or equal to 3/10    Time  12    Period  Weeks    Status  New      OT LONG TERM GOAL #3   Title  Pt will demonstrate  grip strength of at least 30 lbs for RUE functional use.    Time  12    Period  Weeks    Status  New      OT LONG TERM GOAL #4   Title  Pt will  demonstrate at least 90% composite finger flexion for RUE.    Time  12    Period  Weeks    Status  New      OT LONG TERM GOAL #5   Title  Pt will perfrom simulated work activities modified independently    Time  12    Period  Weeks    Status  New            Plan - 06/03/19 1205    Clinical Impression Statement  Pt arrived today without his splint. Pt reports he hasn't been wearing it and MD told him he did not need splint. Therapist reinforced that pt needs to wear splint in between exercises and at night. Pt reports he has splint at home nad he will bring it in for next visit.    OT Occupational Profile and History  Detailed Assessment- Review of Records and additional review of physical, cognitive, psychosocial history related to current functional performance    Occupational performance deficits (Please refer to evaluation for details):  ADL's;IADL's;Rest and Sleep;Work;Play;Leisure;Social Participation    Body Structure / Function / Physical Skills  ADL;Edema;Skin integrity;Strength;UE functional use;Flexibility;FMC;Pain;Proprioception;Coordination;Decreased knowledge of precautions;GMC;ROM;Wound;Scar mobility;Dexterity;IADL;Sensation    Rehab Potential  Good    Clinical Decision Making  Limited treatment options, no task modification necessary    Comorbidities Affecting Occupational Performance:  May have comorbidities impacting occupational performance    Modification or Assistance to Complete Evaluation   No modification of tasks or assist necessary to complete eval    OT Frequency  2x / week   plus eval, will likely see pt 1x week until he is cleared for ROM.   OT Duration  12 weeks    OT Treatment/Interventions  Self-care/ADL training;Therapeutic exercise;Splinting;Manual Therapy;Neuromuscular education;Ultrasound;Therapeutic activities;DME and/or AE instruction;Paraffin;Cryotherapy;Electrical Stimulation;Fluidtherapy;Scar mobilization;Patient/family education;Passive range of  motion;Contrast Bath;Moist Heat  Plan  continue with A/ROM and gentle P/ROM    Consulted and Agree with Plan of Care  Patient       Patient will benefit from skilled therapeutic intervention in order to improve the following deficits and impairments:   Body Structure / Function / Physical Skills: ADL, Edema, Skin integrity, Strength, UE functional use, Flexibility, FMC, Pain, Proprioception, Coordination, Decreased knowledge of precautions, GMC, ROM, Wound, Scar mobility, Dexterity, IADL, Sensation       Visit Diagnosis: Other lack of coordination  Pain in right hand  Stiffness of right hand, not elsewhere classified  Localized edema  Muscle weakness (generalized)    Problem List Patient Active Problem List   Diagnosis Date Noted  . Soft tissue swelling   . Dental infection 05/31/2016    Tiena Manansala 06/03/2019, 12:09 PM Keene Breath, OTR/L [phone] Phone: [phone] 12:11 PM 06/03/19 [facility] [address] [place], [place], [address] Phone: [phone]   Fax:  [phone]  Name: Marc Hebert MRN: [id number] Date of Birth: 08-26-1990

## 2019-06-03 NOTE — Patient Instructions (Addendum)
Wear your splint in between exercises and at night. Bring your splint to your next visit so that I can modify. No lifting, pushing pulling with your right hand. Massage the underside of your ring finger with your left hand gently for 5 mins 1-2 x day to desensitize your finger and to reduce swelling, always massage from finger tip back towards hand.        MP Flexion (Active Isolated)   Bend each finger at large knuckle, keeping other fingers straight. Do not bend tips. Repeat _10-15___ times. Do __4-6__ sessions per day.  AROM: PIP Flexion / Extension   Pinch bottom knuckle of _ring_______ finger of hand to prevent bending. Actively bend middle knuckle until stretch is felt. Hold __5__ seconds. Relax. Straighten finger as far as possible. Repeat __10-15__ times per set. Do _4-6___ sessions per day.   AROM: DIP Flexion / Extension   Pinch middle knuckle of __ring______ finger of  hand to prevent bending. Bend end knuckle until stretch is felt. Hold _5___ seconds. Relax. Straighten finger as far as possible. Repeat _10-15___ times per set.  Do _4-6___ sessions per day.  AROM: Finger Flexion / Extension   Actively bend fingers of  hand. Start with knuckles furthest from palm, and slowly make a fist. Hold __5__ seconds. Relax. Then straighten fingers as far as possible. Repeat _10-15___ times per set.  Do _4-6___ sessions per day.  Copyright  VHI. All rights reserved.     Opposition (Active)   Touch tip of thumb to nail tip of each finger in turn, making an "O" shape. Repeat __10__ times. Do _4-6___ sessions per day.

## 2019-06-05 ENCOUNTER — Ambulatory Visit: Payer: Worker's Compensation | Admitting: Occupational Therapy

## 2019-06-05 ENCOUNTER — Other Ambulatory Visit: Payer: Self-pay

## 2019-06-05 DIAGNOSIS — M79641 Pain in right hand: Secondary | ICD-10-CM

## 2019-06-05 DIAGNOSIS — R600 Localized edema: Secondary | ICD-10-CM

## 2019-06-05 DIAGNOSIS — M25641 Stiffness of right hand, not elsewhere classified: Secondary | ICD-10-CM

## 2019-06-05 DIAGNOSIS — R278 Other lack of coordination: Secondary | ICD-10-CM

## 2019-06-05 NOTE — Therapy (Signed)
[facility] [address] [place], [place], [address] Phone: [phone]   Fax:  [phone]  Occupational Therapy Treatment  Patient Details  Name: Marc Hebert MRN: [id number] Date of Birth: 02/05/1990 Referring Provider (OT): Dr. Claudia Desanctis   Encounter Date: 06/05/2019  OT End of Session - 06/05/19 0940    Visit Number  5    Number of Visits  25    Date for OT Re-Evaluation  07/29/19    Authorization Type  worker's comp    Authorization - Visit Number  5    Authorization - Number of Visits  12    OT Start Time  0935    OT Stop Time  3810    OT Time Calculation (min)  39 min    Activity Tolerance  Patient tolerated treatment well       Past Medical History:  Diagnosis Date  . Hypertension     Past Surgical History:  Procedure Laterality Date  . I & D EXTREMITY Right 04/10/2019   Procedure: IRRIGATION AND DEBRIDEMENT OF RIGHT LONG AND RING FINGERS .;  Surgeon: Cindra Presume, MD;  Location: [facility];  Service: Plastics;  Laterality: Right;  . PERCUTANEOUS PINNING Right 04/10/2019   Procedure: PERCUTANEOUS PINNING RIGHT LONG AND RING FINGERS.;  Surgeon: Cindra Presume, MD;  Location: [facility];  Service: Plastics;  Laterality: Right;    There were no vitals filed for this visit.  Subjective Assessment - 06/05/19 0940    Subjective   Denies pain    Pertinent History  29 y.o. male s/p  severe crush injury that occurred at work.  A metal beam fell on his right ring and long fingers.  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  The ring finger crush was through the level of the middle phalanx and was a near amputation.  Pt underwent I and D right and long fingers, Open reduction internal fixation of right ring finger middle phalanx fracture, Repair of right ring finger FDP using a slip of the FDS as a tendon graft,  Repair of right ring finger extensor tendon, Reconstruction of right ring finger radial  digital nerve with nerve tube, Revision amputation of right long finger    Limitations  Pt is cleared for A/ROM and very gentle P/ROM    Patient Stated Goals  regain use of RUE    Currently in Pain?  No/denies    Pain Onset  1 to 4 weeks ago               treatment: Pt arrived wearing custom forearm splint. Therapist modified splint  to make it  hand based and to include only digits 4 and 5 with increased MP flexion while hotpack applied to RUE x 10 mins, (no adverse reactions) towel added to pad hotpack  due to impaired sensation.  Then pt. Initiated performance of HEP. Pt performed A/ROM MP flexion, IP blocking attempted DIP blocking for ring finger and performed reverse blocking as able, min v.c and facilitation.  Pt reports that new splint fits well and is comfortable.            OT Education - 06/05/19 1252    Education Details  splint wear, care and precautions, ssafe use of hotpack at home andd precu due to sensory impairment and inital A/ROM HEP.    Person(s) Educated  Patient    Methods  Explanation;Demonstration;Verbal cues    Comprehension  Verbalized understanding;Returned  demonstration;Verbal cues required       OT Short Term Goals - 04/30/19 1747      OT SHORT TERM GOAL #1   Title  I with inital  HEP(once cleared by MD for A/ROM.    Time  6    Period  Weeks    Status  New    Target Date  06/14/19      OT SHORT TERM GOAL #2   Title  I with splint wear, care and precautions    Time  6    Period  Weeks    Status  New      OT SHORT TERM GOAL #3   Title  I with dressing changes/ hand hygiene    Time  6    Period  Weeks    Status  New      OT SHORT TERM GOAL #4   Title  Pt will demonstrate at least 50% composite finger flexion for RUE.    Time  6    Period  Weeks    Status  New        OT Long Term Goals - 04/30/19 1749      OT LONG TERM GOAL #1   Title  Pt will demonstrate understanding of updated HEP.    Time  12    Period  Weeks     Status  New    Target Date  07/29/19      OT LONG TERM GOAL #2   Title  Pt will resume use of RUE as a dominant hand at least 90% of the time with pain less than or equal to 3/10    Time  12    Period  Weeks    Status  New      OT LONG TERM GOAL #3   Title  Pt will demonstrate  grip strength of at least 30 lbs for RUE functional use.    Time  12    Period  Weeks    Status  New      OT LONG TERM GOAL #4   Title  Pt will demonstrate at least 90% composite finger flexion for RUE.    Time  12    Period  Weeks    Status  New      OT LONG TERM GOAL #5   Title  Pt will perfrom simulated work activities modified independently    Time  12    Period  Weeks    Status  New              Patient will benefit from skilled therapeutic intervention in order to improve the following deficits and impairments:           Visit Diagnosis: Other lack of coordination  Pain in right hand  Stiffness of right hand, not elsewhere classified  Localized edema    Problem List Patient Active Problem List   Diagnosis Date Noted  . Soft tissue swelling   . Dental infection 05/31/2016    Carleta Woodrow 06/05/2019, 12:53 PM   [facility] [address] [place], [place], [address] Phone: [phone]   Fax:  [phone]  Name: Marc Hebert MRN: [id number] Date of Birth: 03-Nov-1990

## 2019-06-10 ENCOUNTER — Encounter: Payer: Self-pay | Admitting: Occupational Therapy

## 2019-06-10 ENCOUNTER — Other Ambulatory Visit: Payer: Self-pay

## 2019-06-10 ENCOUNTER — Ambulatory Visit: Payer: Worker's Compensation | Attending: Plastic Surgery | Admitting: Occupational Therapy

## 2019-06-10 DIAGNOSIS — R600 Localized edema: Secondary | ICD-10-CM | POA: Insufficient documentation

## 2019-06-10 DIAGNOSIS — R278 Other lack of coordination: Secondary | ICD-10-CM

## 2019-06-10 DIAGNOSIS — M25641 Stiffness of right hand, not elsewhere classified: Secondary | ICD-10-CM | POA: Insufficient documentation

## 2019-06-10 DIAGNOSIS — M79641 Pain in right hand: Secondary | ICD-10-CM | POA: Diagnosis present

## 2019-06-10 DIAGNOSIS — M6281 Muscle weakness (generalized): Secondary | ICD-10-CM | POA: Diagnosis present

## 2019-06-10 NOTE — Therapy (Addendum)
[facility] [address] [place], [place], [address] Phone: [phone]   Fax:  [phone]  Occupational Therapy Treatment  Patient Details  Name: Marc Hebert MRN: [id number] Date of Birth: 1990-11-02 Referring Provider (OT): Dr. Arita Miss   Encounter Date: 06/10/2019  OT End of Session - 06/10/19 1025    Visit Number  6    Number of Visits  25    Date for OT Re-Evaluation  07/29/19    Authorization Type  worker's comp    Authorization - Visit Number  6    Authorization - Number of Visits  12    OT Start Time  1018    OT Stop Time  1100    OT Time Calculation (min)  42 min    Activity Tolerance  Patient tolerated treatment well       Past Medical History:  Diagnosis Date  . Hypertension     Past Surgical History:  Procedure Laterality Date  . I & D EXTREMITY Right 04/10/2019   Procedure: IRRIGATION AND DEBRIDEMENT OF RIGHT LONG AND RING FINGERS .;  Surgeon: Allena Napoleon, MD;  Location: [facility];  Service: Plastics;  Laterality: Right;  . PERCUTANEOUS PINNING Right 04/10/2019   Procedure: PERCUTANEOUS PINNING RIGHT LONG AND RING FINGERS.;  Surgeon: Allena Napoleon, MD;  Location: [facility];  Service: Plastics;  Laterality: Right;    There were no vitals filed for this visit.  Subjective Assessment - 06/10/19 1024    Subjective   Denies pain    Pertinent History  29 y.o. male s/p  severe crush injury that occurred at work.  A metal beam fell on his right ring and long fingers.  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  The ring finger crush was through the level of the middle phalanx and was a near amputation.  Pt underwent I and D right and long fingers, Open reduction internal fixation of right ring finger middle phalanx fracture, Repair of right ring finger FDP using a slip of the FDS as a tendon graft,  Repair of right ring finger extensor tendon, Reconstruction of right ring finger radial  digital nerve with nerve tube, Revision amputation of right long finger    Limitations  Pt is cleared for A/ROM and very gentle P/ROM    Patient Stated Goals  regain use of RUE    Currently in Pain?  No/denies    Pain Onset  1 to 4 weeks ago             Treatment: Pt arrived wearing protective splint. Fluidotherapy x 10 mins to RUE for desensitization and increased flexibility. No adverse reactions. [place] , 0.8 w/cm 2, 20% x 8 mins to right palm and ring finger, pt has 1 area that is hypersensitive on volar ring finger, therapist avoided this area,no adverse reactions. Pt performed A/ROM MP flexion, IP blocking attempted DIP blocking for ring finger and performed reverse blocking as able, min v.c and facilitation. Gentle P/ROM composite finger flexion and PIP flexion. Pt reports that new splint fits well and is comfortable.                 OT Short Term Goals - 06/10/19 1026      OT SHORT TERM GOAL #1   Title  I with inital  HEP(once cleared by MD for A/ROM.    Time  6    Period  Weeks    Status  Achieved    Target Date  06/14/19      OT SHORT TERM GOAL #2   Title  I with splint wear, care and precautions    Time  6    Period  Weeks    Status  Achieved      OT SHORT TERM GOAL #3   Title  I with dressing changes/ hand hygiene    Time  6    Period  Weeks    Status  Achieved      OT SHORT TERM GOAL #4   Title  Pt will demonstrate at least 50% composite finger flexion for RUE.    Time  6    Period  Weeks    Status  On-going        OT Long Term Goals - 04/30/19 1749      OT LONG TERM GOAL #1   Title  Pt will demonstrate understanding of updated HEP.    Time  12    Period  Weeks    Status  New    Target Date  07/29/19      OT LONG TERM GOAL #2   Title  Pt will resume use of RUE as a dominant hand at least 90% of the time with pain less than or equal to 3/10    Time  12    Period  Weeks    Status  New      OT LONG TERM GOAL #3   Title  Pt will  demonstrate  grip strength of at least 30 lbs for RUE functional use.    Time  12    Period  Weeks    Status  New      OT LONG TERM GOAL #4   Title  Pt will demonstrate at least 90% composite finger flexion for RUE.    Time  12    Period  Weeks    Status  New      OT LONG TERM GOAL #5   Title  Pt will perfrom simulated work activities modified independently    Time  12    Period  Weeks    Status  New            Plan - 06/10/19 1025    Clinical Impression Statement  Pt is progressing towards goals with improving A/ROM    OT Occupational Profile and History  Detailed Assessment- Review of Records and additional review of physical, cognitive, psychosocial history related to current functional performance    Occupational performance deficits (Please refer to evaluation for details):  ADL's;IADL's;Rest and Sleep;Work;Play;Leisure;Social Participation    Body Structure / Function / Physical Skills  ADL;Edema;Skin integrity;Strength;UE functional use;Flexibility;FMC;Pain;Proprioception;Coordination;Decreased knowledge of precautions;GMC;ROM;Wound;Scar mobility;Dexterity;IADL;Sensation    Rehab Potential  Good    Clinical Decision Making  Limited treatment options, no task modification necessary    Comorbidities Affecting Occupational Performance:  May have comorbidities impacting occupational performance    Modification or Assistance to Complete Evaluation   No modification of tasks or assist necessary to complete eval    OT Frequency  2x / week   plus eval, will likely see pt 1x week until he is cleared for ROM.   OT Duration  12 weeks    OT Treatment/Interventions  Self-care/ADL training;Therapeutic exercise;Splinting;Manual Therapy;Neuromuscular education;Ultrasound;Therapeutic activities;DME and/or AE instruction;Paraffin;Cryotherapy;Electrical Stimulation;Fluidtherapy;Scar mobilization;Patient/family education;Passive range of motion;Contrast Bath;Moist Heat    Plan  continue with  A/ROM and gentle P/ROM    Consulted and Agree with Plan of  Care  Patient       Patient will benefit from skilled therapeutic intervention in order to improve the following deficits and impairments:   Body Structure / Function / Physical Skills: ADL, Edema, Skin integrity, Strength, UE functional use, Flexibility, FMC, Pain, Proprioception, Coordination, Decreased knowledge of precautions, GMC, ROM, Wound, Scar mobility, Dexterity, IADL, Sensation       Visit Diagnosis: Other lack of coordination  Pain in right hand  Stiffness of right hand, not elsewhere classified  Localized edema  Muscle weakness (generalized)    Problem List Patient Active Problem List   Diagnosis Date Noted  . Soft tissue swelling   . Dental infection 05/31/2016    RINE,KATHRYN 06/10/2019, 10:26 AM  [facility] [address] [place], [place], [address] Phone: [phone]   Fax:  [phone]  Name: MADOX CORKINS MRN: [id number] Date of Birth: [birth year]

## 2019-06-12 ENCOUNTER — Ambulatory Visit: Payer: Worker's Compensation | Admitting: Occupational Therapy

## 2019-06-12 ENCOUNTER — Encounter: Payer: Self-pay | Admitting: Occupational Therapy

## 2019-06-12 ENCOUNTER — Other Ambulatory Visit: Payer: Self-pay

## 2019-06-12 DIAGNOSIS — R278 Other lack of coordination: Secondary | ICD-10-CM

## 2019-06-12 DIAGNOSIS — M79641 Pain in right hand: Secondary | ICD-10-CM

## 2019-06-12 DIAGNOSIS — M25641 Stiffness of right hand, not elsewhere classified: Secondary | ICD-10-CM

## 2019-06-12 DIAGNOSIS — M6281 Muscle weakness (generalized): Secondary | ICD-10-CM

## 2019-06-12 DIAGNOSIS — R600 Localized edema: Secondary | ICD-10-CM

## 2019-06-12 NOTE — Patient Instructions (Signed)
PROM: Finger MP Joints Perform A/ROM exercises first! Perform very gently, stop if increased pain or hard end feel.   PIP Flexion (Passive)   Use other hand to bend the middle joint of _ring_____ finger down as far as possible. Hold _10___ seconds. Repeat __5__ times. Do _4-6___ sessions per day.    PROM: Finger DIP Joints   Passively bend __ring______ finger(s) of  hand at tip joint until stretch is felt. Hold __10__ seconds. Relax. Straighten finger as far as possible. Repeat _5___ times per set.  Do __4-6__ sessions per day.  Copyright  VHI. All rights reserved.

## 2019-06-12 NOTE — Therapy (Signed)
[facility] [address] [place], [place], [address] Phone: [phone]   Fax:  [phone]  Occupational Therapy Treatment  Patient Details  Name: Marc Hebert MRN: [id number] Date of Birth: 1990/05/19 Referring Provider (OT): Dr. Claudia Desanctis   Encounter Date: 06/12/2019  OT End of Session - 06/12/19 1028    Visit Number  7    Number of Visits  25    Date for OT Re-Evaluation  07/29/19    Authorization Type  worker's comp    Authorization - Visit Number  7    Authorization - Number of Visits  12    OT Start Time  1020    OT Stop Time  1100    OT Time Calculation (min)  40 min    Activity Tolerance  Patient tolerated treatment well       Past Medical History:  Diagnosis Date  . Hypertension     Past Surgical History:  Procedure Laterality Date  . I & D EXTREMITY Right 04/10/2019   Procedure: IRRIGATION AND DEBRIDEMENT OF RIGHT LONG AND RING FINGERS .;  Surgeon: Cindra Presume, MD;  Location: [facility];  Service: Plastics;  Laterality: Right;  . PERCUTANEOUS PINNING Right 04/10/2019   Procedure: PERCUTANEOUS PINNING RIGHT LONG AND RING FINGERS.;  Surgeon: Cindra Presume, MD;  Location: [facility];  Service: Plastics;  Laterality: Right;    There were no vitals filed for this visit.  Subjective Assessment - 06/12/19 1023    Subjective   Pt reports his finger is moving more    Pertinent History  29 y.o. male s/p  severe crush injury that occurred at work.  A metal beam fell on his right ring and long fingers.  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  The ring finger crush was through the level of the middle phalanx and was a near amputation.  Pt underwent I and D right and long fingers, Open reduction internal fixation of right ring finger middle phalanx fracture, Repair of right ring finger FDP using a slip of the FDS as a tendon graft,  Repair of right ring finger extensor tendon, Reconstruction of  right ring finger radial digital nerve with nerve tube, Revision amputation of right long finger    Limitations  Pt is cleared for A/ROM and very gentle P/ROM    Special Tests  date of surgery 04/10/19    Patient Stated Goals  regain use of RUE    Currently in Pain?  No/denies    Pain Onset  1 to 4 weeks ago                 Treatment: Pt arrived wearing protective splint. Fluidotherapy x 10 mins to RUE for desensitization and increased flexibility. No adverse reactions. [place] 73mhz, 0.8 w/cm 2, 20% x 8 mins to right palm and ring finger, pt has 1 area that is hypersensitive on volar ring finger, therapist avoided this area,no adverse reactions. Pt performed A/ROM MP flexion, IP blocking attempted DIP blocking for ring finger and performed reverse blocking as able, min v.c and facilitation. Gentle P/ROM composite finger flexion and PIP and DIP flexion NMES 50 pps, 250 pw, 10 secs cycle int. 13 x 5 mins to finger flexors with good response and no adverse reactions. Pt demonstrates improved finger flexion at end of session.          OT Education - 06/12/19 1258    Education Details  gentle  P/ROM to Ring finger, pt was instructed not to push past a hard end feel, and not to push if painful due to severe injury    Person(s) Educated  Patient    Methods  Explanation;Demonstration;Verbal cues;Handout    Comprehension  Verbalized understanding;Returned demonstration;Verbal cues required       OT Short Term Goals - 06/10/19 1026      OT SHORT TERM GOAL #1   Title  I with inital  HEP(once cleared by MD for A/ROM.    Time  6    Period  Weeks    Status  Achieved    Target Date  06/14/19      OT SHORT TERM GOAL #2   Title  I with splint wear, care and precautions    Time  6    Period  Weeks    Status  Achieved      OT SHORT TERM GOAL #3   Title  I with dressing changes/ hand hygiene    Time  6    Period  Weeks    Status  Achieved      OT SHORT TERM GOAL #4   Title  Pt will  demonstrate at least 50% composite finger flexion for RUE.    Time  6    Period  Weeks    Status  On-going        OT Long Term Goals - 04/30/19 1749      OT LONG TERM GOAL #1   Title  Pt will demonstrate understanding of updated HEP.    Time  12    Period  Weeks    Status  New    Target Date  07/29/19      OT LONG TERM GOAL #2   Title  Pt will resume use of RUE as a dominant hand at least 90% of the time with pain less than or equal to 3/10    Time  12    Period  Weeks    Status  New      OT LONG TERM GOAL #3   Title  Pt will demonstrate  grip strength of at least 30 lbs for RUE functional use.    Time  12    Period  Weeks    Status  New      OT LONG TERM GOAL #4   Title  Pt will demonstrate at least 90% composite finger flexion for RUE.    Time  12    Period  Weeks    Status  New      OT LONG TERM GOAL #5   Title  Pt will perfrom simulated work activities modified independently    Time  12    Period  Weeks    Status  New            Plan - 06/12/19 1300    Clinical Impression Statement  Pt is progressing towards goals with improving A/ROM in ring finger. pt demonstrates understanding of gentle P/ROM to ring finger.    OT Occupational Profile and History  Detailed Assessment- Review of Records and additional review of physical, cognitive, psychosocial history related to current functional performance    Occupational performance deficits (Please refer to evaluation for details):  ADL's;IADL's;Rest and Sleep;Work;Play;Leisure;Social Participation    Body Structure / Function / Physical Skills  ADL;Edema;Skin integrity;Strength;UE functional use;Flexibility;FMC;Pain;Proprioception;Coordination;Decreased knowledge of precautions;GMC;ROM;Wound;Scar mobility;Dexterity;IADL;Sensation    Rehab Potential  Good    Clinical Decision Making  Limited treatment options,  no task modification necessary    Comorbidities Affecting Occupational Performance:  May have comorbidities  impacting occupational performance    Modification or Assistance to Complete Evaluation   No modification of tasks or assist necessary to complete eval    OT Frequency  2x / week   plus eval, will likely see pt 1x week until he is cleared for ROM.   OT Duration  12 weeks    OT Treatment/Interventions  Self-care/ADL training;Therapeutic exercise;Splinting;Manual Therapy;Neuromuscular education;Ultrasound;Therapeutic activities;DME and/or AE instruction;Paraffin;Cryotherapy;Electrical Stimulation;Fluidtherapy;Scar mobilization;Patient/family education;Passive range of motion;Contrast Bath;Moist Heat    Plan  continue with A/ROM and gentle P/ROM, [place], estim, fluido prn    Consulted and Agree with Plan of Care  Patient       Patient will benefit from skilled therapeutic intervention in order to improve the following deficits and impairments:   Body Structure / Function / Physical Skills: ADL, Edema, Skin integrity, Strength, UE functional use, Flexibility, FMC, Pain, Proprioception, Coordination, Decreased knowledge of precautions, GMC, ROM, Wound, Scar mobility, Dexterity, IADL, Sensation       Visit Diagnosis: Other lack of coordination  Pain in right hand  Stiffness of right hand, not elsewhere classified  Localized edema  Muscle weakness (generalized)    Problem List Patient Active Problem List   Diagnosis Date Noted  . Soft tissue swelling   . Dental infection 05/31/2016    Dainelle Hun 06/12/2019, 1:01 PM  [facility] [address] [place], [place], [address] Phone: [phone]   Fax:  [phone]  Name: Marc Hebert MRN: [id number] Date of Birth: 1990-08-15

## 2019-06-17 ENCOUNTER — Encounter: Payer: Self-pay | Admitting: Occupational Therapy

## 2019-06-17 ENCOUNTER — Ambulatory Visit: Payer: Worker's Compensation | Attending: Plastic Surgery | Admitting: Occupational Therapy

## 2019-06-17 ENCOUNTER — Other Ambulatory Visit: Payer: Self-pay

## 2019-06-17 DIAGNOSIS — R600 Localized edema: Secondary | ICD-10-CM | POA: Diagnosis present

## 2019-06-17 DIAGNOSIS — R278 Other lack of coordination: Secondary | ICD-10-CM | POA: Insufficient documentation

## 2019-06-17 DIAGNOSIS — M25641 Stiffness of right hand, not elsewhere classified: Secondary | ICD-10-CM | POA: Diagnosis present

## 2019-06-17 DIAGNOSIS — M79641 Pain in right hand: Secondary | ICD-10-CM

## 2019-06-17 DIAGNOSIS — M6281 Muscle weakness (generalized): Secondary | ICD-10-CM | POA: Diagnosis present

## 2019-06-17 NOTE — Therapy (Signed)
[facility] [address] [place], [place], [address] Phone: [phone]   Fax:  [phone]  Occupational Therapy Treatment  Patient Details  Name: Marc Hebert MRN: [id number] Date of Birth: 12/15/1990 Referring Provider (OT): Dr. Arita Miss   Encounter Date: 06/17/2019  OT End of Session - 06/17/19 1049    Visit Number  8    Number of Visits  25    Date for OT Re-Evaluation  07/29/19    Authorization Type  worker's comp    Authorization - Visit Number  8    Authorization - Number of Visits  12    OT Start Time  1018    OT Stop Time  1100    OT Time Calculation (min)  42 min    Activity Tolerance  Patient tolerated treatment well       Past Medical History:  Diagnosis Date  . Hypertension     Past Surgical History:  Procedure Laterality Date  . I & D EXTREMITY Right 04/10/2019   Procedure: IRRIGATION AND DEBRIDEMENT OF RIGHT LONG AND RING FINGERS .;  Surgeon: Allena Napoleon, MD;  Location: [facility] OR;  Service: Plastics;  Laterality: Right;  . PERCUTANEOUS PINNING Right 04/10/2019   Procedure: PERCUTANEOUS PINNING RIGHT LONG AND RING FINGERS.;  Surgeon: Allena Napoleon, MD;  Location: [facility] OR;  Service: Plastics;  Laterality: Right;    There were no vitals filed for this visit.  Subjective Assessment - 06/17/19 1047    Subjective   Pt reports hypersensitivity at 1 spot on ring finger    Pertinent History  29 y.o. male s/p  severe crush injury that occurred at work.  A metal beam fell on his right ring and long fingers.  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  The ring finger crush was through the level of the middle phalanx and was a near amputation.  Pt underwent I and D right and long fingers, Open reduction internal fixation of right ring finger middle phalanx fracture, Repair of right ring finger FDP using a slip of the FDS as a tendon graft,  Repair of right ring finger extensor tendon,  Reconstruction of right ring finger radial digital nerve with nerve tube, Revision amputation of right long finger    Limitations  Pt is cleared for A/ROM and very gentle P/ROM    Special Tests  date of surgery 04/10/19    Patient Stated Goals  regain use of RUE    Currently in Pain?  No/denies   no pain at rest, pain 5/10 if hypersensitive spot is touched.            Treatment: Treatment: Pt arrived wearing protective splint. [place] , 0.8 w/cm 2, 20% x 8 mins to right palm and ring finger, pt has 1 area that is hypersensitive on volar ring finger, therapist used caution in this area,no adverse reactions. Pt performed A/ROM MP flexion, IP blocking attempted DIP blocking for ring finger(no A/ROM DIP flexion noted) and pt  performed reverse blocking as able, min v.c and facilitation. Gentle P/ROM composite finger flexion and PIP and DIP flexion NMES 50 pps, 250 pw, 10 secs cycle int. 13 x 15 mins to finger flexors with good response and no adverse reactions. Pt demonstrates improved finger flexion at end of session.                 OT Short Term Goals - 06/17/19 1051  OT SHORT TERM GOAL #1   Title  I with inital  HEP(once cleared by MD for A/ROM.    Time  6    Period  Weeks    Status  Achieved    Target Date  06/14/19      OT SHORT TERM GOAL #2   Title  I with splint wear, care and precautions    Time  6    Period  Weeks    Status  Achieved      OT SHORT TERM GOAL #3   Title  I with dressing changes/ hand hygiene    Time  6    Period  Weeks    Status  Achieved      OT SHORT TERM GOAL #4   Title  Pt will demonstrate at least 50% composite finger flexion for RUE.    Time  6    Period  Weeks    Status  Achieved   RUE ring finger PIP 55, MP 85, no A/ROM at DIP       OT Long Term Goals - 06/17/19 1052      OT LONG TERM GOAL #1   Title  Pt will demonstrate understanding of updated HEP.    Time  12    Period  Weeks    Status  New      OT LONG TERM  GOAL #2   Title  Pt will resume use of RUE as a dominant hand at least 90% of the time with pain less than or equal to 3/10    Time  12    Period  Weeks    Status  New      OT LONG TERM GOAL #3   Title  Pt will demonstrate  grip strength of at least 30 lbs for RUE functional use.    Time  12    Period  Weeks    Status  New      OT LONG TERM GOAL #4   Title  Pt will demonstrate at least 90% composite finger flexion for RUE.    Time  12    Period  Weeks    Status  New      OT LONG TERM GOAL #5   Title  Pt will perfrom simulated work activities modified independently    Time  12    Period  Weeks    Status  New            Plan - 06/17/19 1050    Clinical Impression Statement  Pt is progressing towards goals with improving A/ROM in ring finger. Pt continues to have hypersensitive spot on ring finger incision.    OT Occupational Profile and History  Detailed Assessment- Review of Records and additional review of physical, cognitive, psychosocial history related to current functional performance    Occupational performance deficits (Please refer to evaluation for details):  ADL's;IADL's;Rest and Sleep;Work;Play;Leisure;Social Participation    Body Structure / Function / Physical Skills  ADL;Edema;Skin integrity;Strength;UE functional use;Flexibility;FMC;Pain;Proprioception;Coordination;Decreased knowledge of precautions;GMC;ROM;Wound;Scar mobility;Dexterity;IADL;Sensation    Rehab Potential  Good    Clinical Decision Making  Limited treatment options, no task modification necessary    Comorbidities Affecting Occupational Performance:  May have comorbidities impacting occupational performance    Modification or Assistance to Complete Evaluation   No modification of tasks or assist necessary to complete eval    OT Frequency  2x / week   plus eval, will likely see pt 1x week until he is  cleared for ROM.   OT Duration  12 weeks    OT Treatment/Interventions  Self-care/ADL  training;Therapeutic exercise;Splinting;Manual Therapy;Neuromuscular education;Ultrasound;Therapeutic activities;DME and/or AE instruction;Paraffin;Cryotherapy;Electrical Stimulation;Fluidtherapy;Scar mobilization;Patient/family education;Passive range of motion;Contrast Bath;Moist Heat    Plan  continue with A/ROM and very gentle P/ROM, [place], estim,  prn    Consulted and Agree with Plan of Care  Patient       Patient will benefit from skilled therapeutic intervention in order to improve the following deficits and impairments:   Body Structure / Function / Physical Skills: ADL, Edema, Skin integrity, Strength, UE functional use, Flexibility, FMC, Pain, Proprioception, Coordination, Decreased knowledge of precautions, GMC, ROM, Wound, Scar mobility, Dexterity, IADL, Sensation       Visit Diagnosis: Other lack of coordination  Pain in right hand  Stiffness of right hand, not elsewhere classified  Localized edema    Problem List Patient Active Problem List   Diagnosis Date Noted  . Soft tissue swelling   . Dental infection 05/31/2016    Judieth Mckown 06/17/2019, 10:53 AM Theone Murdoch, OTR/L [phone] Phone: [phone] 11:14 AM 06/17/19 [facility] [address] [place], [place], [address] Phone: [phone]   Fax:  [phone]  Name: KENNEY GOING MRN: [id number] Date of Birth: 1990/07/27

## 2019-06-19 ENCOUNTER — Other Ambulatory Visit: Payer: Self-pay

## 2019-06-19 ENCOUNTER — Ambulatory Visit: Payer: Worker's Compensation | Admitting: Occupational Therapy

## 2019-06-19 DIAGNOSIS — R278 Other lack of coordination: Secondary | ICD-10-CM | POA: Diagnosis not present

## 2019-06-19 DIAGNOSIS — M25641 Stiffness of right hand, not elsewhere classified: Secondary | ICD-10-CM

## 2019-06-19 DIAGNOSIS — R600 Localized edema: Secondary | ICD-10-CM

## 2019-06-19 NOTE — Therapy (Signed)
[facility] [address] [place], [place], [address] Phone: [phone]   Fax:  [phone]  Occupational Therapy Treatment  Patient Details  Name: Marc Hebert MRN: [id number] Date of Birth: 07-27-1990 Referring Provider (OT): Dr. Arita Miss   Encounter Date: 06/19/2019  OT End of Session - 06/19/19 1136    Visit Number  9    Number of Visits  25    Date for OT Re-Evaluation  07/29/19    Authorization Type  worker's comp    Authorization - Visit Number  9    Authorization - Number of Visits  12    OT Start Time  1100    OT Stop Time  1145    OT Time Calculation (min)  45 min    Activity Tolerance  Patient tolerated treatment well       Past Medical History:  Diagnosis Date  . Hypertension     Past Surgical History:  Procedure Laterality Date  . I & D EXTREMITY Right 04/10/2019   Procedure: IRRIGATION AND DEBRIDEMENT OF RIGHT LONG AND RING FINGERS .;  Surgeon: Allena Napoleon, MD;  Location: [facility] OR;  Service: Plastics;  Laterality: Right;  . PERCUTANEOUS PINNING Right 04/10/2019   Procedure: PERCUTANEOUS PINNING RIGHT LONG AND RING FINGERS.;  Surgeon: Allena Napoleon, MD;  Location: [facility] OR;  Service: Plastics;  Laterality: Right;    There were no vitals filed for this visit.  Subjective Assessment - 06/19/19 1115    Pertinent History  29 y.o. male s/p  severe crush injury that occurred at work.  A metal beam fell on his right ring and long fingers.  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  The ring finger crush was through the level of the middle phalanx and was a near amputation.  Pt underwent I and D right and long fingers, Open reduction internal fixation of right ring finger middle phalanx fracture, Repair of right ring finger FDP using a slip of the FDS as a tendon graft,  Repair of right ring finger extensor tendon, Reconstruction of right ring finger radial digital nerve with nerve tube,  Revision amputation of right long finger    Limitations  Pt is cleared for A/ROM and very gentle P/ROM    Special Tests  date of surgery 04/10/19    Patient Stated Goals  regain use of RUE    Currently in Pain?  No/denies      Ultrasound x 8 min over ring finger and palm at base of finger 20% pulsed, 3 Mhz, 0.8 wts/cm2 w/ caution over sensitive area.  A/ROM, place and hold, and gentle P/ROM to Rt ring finger in blocking, reverse blocking, full composite flexion and tendon gliding ex's. PIP flex in full composite flex = 65*, PIP flex w/ MP blocked in ext = 61*.  Pt able to touch palm w/ P/ROM.  NMES x 10 min to finger flexors at previous parameters.                        OT Short Term Goals - 06/17/19 1051      OT SHORT TERM GOAL #1   Title  I with inital  HEP(once cleared by MD for A/ROM.    Time  6    Period  Weeks    Status  Achieved    Target Date  06/14/19      OT SHORT TERM GOAL #2  Title  I with splint wear, care and precautions    Time  6    Period  Weeks    Status  Achieved      OT SHORT TERM GOAL #3   Title  I with dressing changes/ hand hygiene    Time  6    Period  Weeks    Status  Achieved      OT SHORT TERM GOAL #4   Title  Pt will demonstrate at least 50% composite finger flexion for RUE.    Time  6    Period  Weeks    Status  Achieved   RUE ring finger PIP 55, MP 85, no A/ROM at DIP       OT Long Term Goals - 06/17/19 1052      OT LONG TERM GOAL #1   Title  Pt will demonstrate understanding of updated HEP.    Time  12    Period  Weeks    Status  New      OT LONG TERM GOAL #2   Title  Pt will resume use of RUE as a dominant hand at least 90% of the time with pain less than or equal to 3/10    Time  12    Period  Weeks    Status  New      OT LONG TERM GOAL #3   Title  Pt will demonstrate  grip strength of at least 30 lbs for RUE functional use.    Time  12    Period  Weeks    Status  New      OT LONG TERM GOAL #4    Title  Pt will demonstrate at least 90% composite finger flexion for RUE.    Time  12    Period  Weeks    Status  New      OT LONG TERM GOAL #5   Title  Pt will perfrom simulated work activities modified independently    Time  12    Industry - 06/19/19 1136    Clinical Impression Statement  Pt progressing with ROM Rt ring PIP joint. Pt continues to have hypersensitive spot on ring finger incision    Occupational performance deficits (Please refer to evaluation for details):  ADL's;IADL's;Rest and Sleep;Work;Play;Leisure;Social Participation    Body Structure / Function / Physical Skills  ADL;Edema;Skin integrity;Strength;UE functional use;Flexibility;FMC;Pain;Proprioception;Coordination;Decreased knowledge of precautions;GMC;ROM;Wound;Scar mobility;Dexterity;IADL;Sensation    Rehab Potential  Good    OT Frequency  2x / week    OT Duration  12 weeks    OT Treatment/Interventions  Self-care/ADL training;Therapeutic exercise;Splinting;Manual Therapy;Neuromuscular education;Ultrasound;Therapeutic activities;DME and/or AE instruction;Paraffin;Cryotherapy;Electrical Stimulation;Fluidtherapy;Scar mobilization;Patient/family education;Passive range of motion;Contrast Bath;Moist Heat    Plan  continue with A/ROM and very gentle P/ROM, [place]- using care over hypersensitive spot or consider fluido wrapped in flex, estim,  prn    Consulted and Agree with Plan of Care  Patient       Patient will benefit from skilled therapeutic intervention in order to improve the following deficits and impairments:   Body Structure / Function / Physical Skills: ADL, Edema, Skin integrity, Strength, UE functional use, Flexibility, FMC, Pain, Proprioception, Coordination, Decreased knowledge of precautions, GMC, ROM, Wound, Scar mobility, Dexterity, IADL, Sensation       Visit Diagnosis: Stiffness of right hand, not elsewhere classified  Localized edema    Problem  List Patient Active Problem List   Diagnosis Date Noted  . Soft tissue swelling   . Dental infection 05/31/2016    Kelli Churn, OTR/L 06/19/2019, 11:38 AM   [facility] [address] [place], [place], [address] Phone: [phone]   Fax:  [phone]  Name: Marc Hebert MRN: [id number] Date of Birth: 02/01/1990

## 2019-06-25 ENCOUNTER — Encounter: Payer: Self-pay | Admitting: Occupational Therapy

## 2019-06-25 ENCOUNTER — Other Ambulatory Visit: Payer: Self-pay

## 2019-06-25 ENCOUNTER — Ambulatory Visit: Payer: Worker's Compensation | Attending: Plastic Surgery | Admitting: Occupational Therapy

## 2019-06-25 DIAGNOSIS — M6281 Muscle weakness (generalized): Secondary | ICD-10-CM | POA: Diagnosis present

## 2019-06-25 DIAGNOSIS — R278 Other lack of coordination: Secondary | ICD-10-CM | POA: Insufficient documentation

## 2019-06-25 DIAGNOSIS — R600 Localized edema: Secondary | ICD-10-CM | POA: Diagnosis present

## 2019-06-25 DIAGNOSIS — M25641 Stiffness of right hand, not elsewhere classified: Secondary | ICD-10-CM | POA: Diagnosis not present

## 2019-06-25 DIAGNOSIS — M79641 Pain in right hand: Secondary | ICD-10-CM | POA: Diagnosis present

## 2019-06-25 NOTE — Therapy (Signed)
[facility] [address] [place], [place], [address] Phone: [phone]   Fax:  [phone]  Occupational Therapy Treatment  Patient Details  Name: Marc Hebert MRN: [id number] Date of Birth: 06-27-1990 Referring Provider (OT): Dr. Arita Miss   Encounter Date: 06/25/2019  OT End of Session - 06/25/19 1222    Visit Number  10    Number of Visits  25    Date for OT Re-Evaluation  07/29/19    Authorization Type  worker's comp    Authorization - Visit Number  10    Authorization - Number of Visits  12    OT Start Time  1150    OT Stop Time  1230    OT Time Calculation (min)  40 min    Activity Tolerance  Patient tolerated treatment well       Past Medical History:  Diagnosis Date  . Hypertension     Past Surgical History:  Procedure Laterality Date  . I & D EXTREMITY Right 04/10/2019   Procedure: IRRIGATION AND DEBRIDEMENT OF RIGHT LONG AND RING FINGERS .;  Surgeon: Allena Napoleon, MD;  Location: [facility] OR;  Service: Plastics;  Laterality: Right;  . PERCUTANEOUS PINNING Right 04/10/2019   Procedure: PERCUTANEOUS PINNING RIGHT LONG AND RING FINGERS.;  Surgeon: Allena Napoleon, MD;  Location: [facility] OR;  Service: Plastics;  Laterality: Right;    There were no vitals filed for this visit.  Subjective Assessment - 06/25/19 1221    Subjective   Pt reports hypersensitivity at 1 spot on ring finger is worse today    Pertinent History  29 y.o. male s/p  severe crush injury that occurred at work.  A metal beam fell on his right ring and long fingers.  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  The ring finger crush was through the level of the middle phalanx and was a near amputation.  Pt underwent I and D right and long fingers, Open reduction internal fixation of right ring finger middle phalanx fracture, Repair of right ring finger FDP using a slip of the FDS as a tendon graft,  Repair of right ring finger  extensor tendon, Reconstruction of right ring finger radial digital nerve with nerve tube, Revision amputation of right long finger    Limitations  Pt is cleared for A/ROM and very gentle P/ROM    Special Tests  date of surgery 04/10/19    Patient Stated Goals  regain use of RUE    Currently in Pain?  Yes    Pain Score  5     Pain Location  Finger (Comment which one)   middle   Pain Orientation  Right    Pain Descriptors / Indicators  Aching    Pain Type  Neuropathic pain    Pain Onset  More than a month ago    Pain Frequency  Intermittent    Aggravating Factors   touching it    Pain Relieving Factors  inactivity                 Ultrasound x 8 min over ring finger and palm at base of finger 20% pulsed, 3 Mhz, 0.8 wts/cm2 w/ caution over sensitive area.  A/ROM, place and hold, and gentle P/ROM to Rt ring finger in blocking, reverse blocking, full composite flexion, PIP flexion blocking  Finger stockinette placed over middle finger stump for gentle compression and fingertip protection.  PIP flex in full  composite flex = 65* Pt able to touch palm w/ P/ROM.  NMES x 11 min to finger flexors 50 pps, 250 pw, 10 secs cycle, intensity 12, no adverse reactions.              OT Short Term Goals - 06/17/19 1051      OT SHORT TERM GOAL #1   Title  I with inital  HEP(once cleared by MD for A/ROM.    Time  6    Period  Weeks    Status  Achieved    Target Date  06/14/19      OT SHORT TERM GOAL #2   Title  I with splint wear, care and precautions    Time  6    Period  Weeks    Status  Achieved      OT SHORT TERM GOAL #3   Title  I with dressing changes/ hand hygiene    Time  6    Period  Weeks    Status  Achieved      OT SHORT TERM GOAL #4   Title  Pt will demonstrate at least 50% composite finger flexion for RUE.    Time  6    Period  Weeks    Status  Achieved   RUE ring finger PIP 55, MP 85, no A/ROM at DIP       OT Long Term Goals - 06/17/19 1052       OT LONG TERM GOAL #1   Title  Pt will demonstrate understanding of updated HEP.    Time  12    Period  Weeks    Status  New      OT LONG TERM GOAL #2   Title  Pt will resume use of RUE as a dominant hand at least 90% of the time with pain less than or equal to 3/10    Time  12    Period  Weeks    Status  New      OT LONG TERM GOAL #3   Title  Pt will demonstrate  grip strength of at least 30 lbs for RUE functional use.    Time  12    Period  Weeks    Status  New      OT LONG TERM GOAL #4   Title  Pt will demonstrate at least 90% composite finger flexion for RUE.    Time  12    Period  Weeks    Status  New      OT LONG TERM GOAL #5   Title  Pt will perfrom simulated work activities modified independently    Time  12    Period  Weeks    Status  New            Plan - 06/25/19 1227    Clinical Impression Statement  Pt demonstrates overall progress with PIP A/ROM.  Pt continues to have hypersensitive spot on ring finger incision. Pt sees MD tomorrrow.    Occupational performance deficits (Please refer to evaluation for details):  ADL's;IADL's;Rest and Sleep;Work;Play;Leisure;Social Participation    Body Structure / Function / Physical Skills  ADL;Edema;Skin integrity;Strength;UE functional use;Flexibility;FMC;Pain;Proprioception;Coordination;Decreased knowledge of precautions;GMC;ROM;Wound;Scar mobility;Dexterity;IADL;Sensation    Rehab Potential  Good    OT Frequency  2x / week    OT Duration  12 weeks    OT Treatment/Interventions  Self-care/ADL training;Therapeutic exercise;Splinting;Manual Therapy;Neuromuscular education;Ultrasound;Therapeutic activities;DME and/or AE instruction;Paraffin;Cryotherapy;Electrical Stimulation;Fluidtherapy;Scar mobilization;Patient/family education;Passive range of motion;Contrast Bath;Moist Heat  Plan  continue with A/ROM and very gentle P/ROM, [place]- using care over hypersensitive spot or consider fluido wrapped in flex, estim,  prn -  progress per MD recommendations.   Consulted and Agree with Plan of Care  Patient       Patient will benefit from skilled therapeutic intervention in order to improve the following deficits and impairments:   Body Structure / Function / Physical Skills: ADL, Edema, Skin integrity, Strength, UE functional use, Flexibility, FMC, Pain, Proprioception, Coordination, Decreased knowledge of precautions, GMC, ROM, Wound, Scar mobility, Dexterity, IADL, Sensation       Visit Diagnosis: Stiffness of right hand, not elsewhere classified  Localized edema  Other lack of coordination  Pain in right hand  Muscle weakness (generalized)    Problem List Patient Active Problem List   Diagnosis Date Noted  . Soft tissue swelling   . Dental infection 05/31/2016    Sofiah Lyne 06/25/2019, 12:28 PM Theone Murdoch, OTR/L [phone] Phone: [phone] 12:41 PM 06/25/19 [facility] [address] [place], [place], [address] Phone: [phone]   Fax:  [phone]  Name: RASEAN JOOS MRN: [id number] Date of Birth: Apr 23, 1990

## 2019-06-26 ENCOUNTER — Encounter: Payer: Self-pay | Admitting: Plastic Surgery

## 2019-06-26 ENCOUNTER — Ambulatory Visit (INDEPENDENT_AMBULATORY_CARE_PROVIDER_SITE_OTHER): Payer: Worker's Compensation | Admitting: Plastic Surgery

## 2019-06-26 VITALS — BP 166/99 | HR 89 | Temp 97.8°F | Ht 76.0 in | Wt 287.0 lb

## 2019-06-26 DIAGNOSIS — S62622B Displaced fracture of medial phalanx of right middle finger, initial encounter for open fracture: Secondary | ICD-10-CM

## 2019-06-26 NOTE — Progress Notes (Signed)
Patient presents about 10 weeks out from an injury to his right hand. He had a severe crush injury that caused amputation of the long finger.  The ring finger was nearly amputated that was held on basically by the ulnar neurovascular bundle.  He required repair of the FDP tendon with a slip of the FDS and a nerve tube was used to bridge the gap in his digital nerve on that side.  Additionally his extensor tendon was mostly crushed and that was repaired also.  His main complaints at this point are one focal area of sensitivity at the long finger stump where it feels fairly thin and an additional area of sensitivity in the area of his nerve reconstruction on the ring finger.  He is able to flex his PIP joint to about 65 degrees and has about a 10 degree extensor lag.  He has no active motion at the DIP joint of the ring finger.  Otherwise his incisions and lacerations of healed well and the bone feels stable.  Again to have him continue with therapy for another couple months before considering intervening operatively.  I think we could do a revision amputation of the long finger stump and excised the area that is thin and I expect that we will help him with sensitivity there.  In regards to the ring finger we have to entertain the pros and cons of the an attempt at a tenolysis and potential capsulotomies of the PIP joint to try to encourage some more motion there.  At the time I may be able to dissect out his nerve and bridge it with a piece of allograft.  We will plan to discuss all this again at his next visit with me.  I want to keep him on light duty for now as he cannot perform the full duties of his job just yet.  I do not think he requires any further splinting as he is almost 3 months out.  All of his questions were answered.

## 2019-06-27 ENCOUNTER — Encounter: Payer: Self-pay | Admitting: Occupational Therapy

## 2019-06-27 ENCOUNTER — Ambulatory Visit: Payer: Worker's Compensation | Admitting: Occupational Therapy

## 2019-06-27 ENCOUNTER — Other Ambulatory Visit: Payer: Self-pay

## 2019-06-27 DIAGNOSIS — M25641 Stiffness of right hand, not elsewhere classified: Secondary | ICD-10-CM

## 2019-06-27 DIAGNOSIS — M79641 Pain in right hand: Secondary | ICD-10-CM

## 2019-06-27 DIAGNOSIS — R278 Other lack of coordination: Secondary | ICD-10-CM

## 2019-06-27 DIAGNOSIS — R600 Localized edema: Secondary | ICD-10-CM

## 2019-06-27 NOTE — Therapy (Addendum)
[facility] [address] [place], [place], [address] Phone: [phone]   Fax:  [phone]  Occupational Therapy Treatment  Patient Details  Name: Marc Hebert MRN: [id number] Date of Birth: Sep 07, 1990 Referring Provider (OT): Dr. Arita Miss   Encounter Date: 06/27/2019  OT End of Session - 06/27/19 1105    Visit Number  11    Number of Visits  25    Date for OT Re-Evaluation  07/29/19    Authorization Type  worker's comp    Authorization - Visit Number  11    Authorization - Number of Visits  12    OT Start Time  1058    OT Stop Time  1145    OT Time Calculation (min)  47 min    Activity Tolerance  Patient tolerated treatment well       Past Medical History:  Diagnosis Date  . Hypertension     Past Surgical History:  Procedure Laterality Date  . I & D EXTREMITY Right 04/10/2019   Procedure: IRRIGATION AND DEBRIDEMENT OF RIGHT LONG AND RING FINGERS .;  Surgeon: Allena Napoleon, MD;  [facility];  Service: Plastics;  Laterality: Right;  . PERCUTANEOUS PINNING Right 04/10/2019   Procedure: PERCUTANEOUS PINNING RIGHT LONG AND RING FINGERS.;  Surgeon: Allena Napoleon, MD;  Location: [facility] OR;  Service: Plastics;  Laterality: Right;    There were no vitals filed for this visit.  Subjective Assessment - 06/27/19 1103    Pertinent History  29 y.o. male s/p  severe crush injury that occurred at work.  A metal beam fell on his right ring and long fingers.  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  The ring finger crush was through the level of the middle phalanx and was a near amputation.  Pt underwent I and D right and long fingers, Open reduction internal fixation of right ring finger middle phalanx fracture, Repair of right ring finger FDP using a slip of the FDS as a tendon graft,  Repair of right ring finger extensor tendon, Reconstruction of right ring finger radial digital nerve with nerve tube,  Revision amputation of right long finger    Limitations  06/26/19- pt cleared for more agressive therapy with no limits per Dr Arita Miss    Special Tests  date of surgery 04/10/19    Patient Stated Goals  regain use of RUE    Currently in Pain?  Yes    Pain Score  5     Pain Location  Finger (Comment which one)    Pain Orientation  Right    Pain Descriptors / Indicators  Aching    Pain Onset  More than a month ago    Pain Frequency  Intermittent    Aggravating Factors   touching sensitive area on finger    Pain Relieving Factors  heat             Treatment: Fluidotherapy x 10 mins to RUE for desensitization and stiffness, with pt performing a/ROM while in fluido,  no adverse reactions. Tendon gliding exercises A/ROM, PIP blocking A/ROM reverse blocking, place and holds for composite flexion and P/ROM PIP flexion. NMES 50 pps, 250 pw, 10 secs cycle intensity 11 x 10 mins, for finger flexion, no adverse reactions. Ice pack x 5 mins end of session for pain relief.  A/ROM: Ring finger  DIP flexion 0,PIP flexion 65, -10 extensor lag MP flexion 90 Grip 22.4 lbs  OT Education - 06/27/19 1351    Education Details  P/ROM to PIP and composite flexion, yellow putty exercises for grip and pinch    Person(s) Educated  Patient    Methods  Explanation;Demonstration;Verbal cues    Comprehension  Verbalized understanding;Returned demonstration;Verbal cues required       OT Short Term Goals - 06/27/19 1139      OT SHORT TERM GOAL #1   Title  I with inital  HEP(once cleared by MD for A/ROM.    Time  6    Period  Weeks    Status  Achieved    Target Date  06/14/19      OT SHORT TERM GOAL #2   Title  I with splint wear, care and precautions    Time  6    Period  Weeks    Status  Achieved      OT SHORT TERM GOAL #3   Title  I with dressing changes/ hand hygiene    Time  6    Period  Weeks    Status  Achieved      OT SHORT TERM GOAL #4   Title  Pt will demonstrate  at least 50% composite finger flexion for RUE.    Time  6    Period  Weeks    Status  Achieved   RUE ring finger PIP 55, MP 85, no A/ROM at DIP       OT Long Term Goals - 06/27/19 1139      OT LONG TERM GOAL #1   Title  Pt will demonstrate understanding of updated HEP.    Time  12    Period  Weeks    Status  New      OT LONG TERM GOAL #2   Title  Pt will resume use of RUE as a dominant hand at least 90% of the time with pain less than or equal to 3/10    Time  12    Period  Weeks    Status  On-going   uses RUE 5-10% of the time     OT LONG TERM GOAL #3   Title  Pt will demonstrate  grip strength of at least 30 lbs for RUE functional use.    Baseline  06/27/19 22.4 lbs    Time  12    Period  Weeks    Status  New   grip 22.4 lbs     OT LONG TERM GOAL #4   Title  Pt will demonstrate at least 90% composite finger flexion for RUE.    Time  12    Period  Weeks    Status  New   grossly 75-80%     OT LONG TERM GOAL #5   Title  Pt will perfrom simulated work activities modified independently    Time  12    Period  Weeks    Status  On-going            Plan - 06/27/19 1352    Clinical Impression Statement  Pt demonstrates overall progress towards goals. Pt's A/ROM PIP flexion for ring finger is limited to 65*, with an extension lag of -10. Pt has no active DIP flexion. Pt is now cleared for agressive ROM per MD with no limitations. Pt can benefit from continued skilled occupational therapy to increase ROM and strength in pt's RUE dominant hand in order to increase safety and I with daily activities.    Occupational performance  deficits (Please refer to evaluation for details):  ADL's;IADL's;Rest and Sleep;Work;Play;Leisure;Social Participation    Body Structure / Function / Physical Skills  ADL;Edema;Skin integrity;Strength;UE functional use;Flexibility;FMC;Pain;Proprioception;Coordination;Decreased knowledge of precautions;GMC;ROM;Wound;Scar  mobility;Dexterity;IADL;Sensation    Rehab Potential  Good    OT Frequency  2x / week    OT Duration  12 weeks    OT Treatment/Interventions  Self-care/ADL training;Therapeutic exercise;Splinting;Manual Therapy;Neuromuscular education;Ultrasound;Therapeutic activities;DME and/or AE instruction;Paraffin;Cryotherapy;Electrical Stimulation;Fluidtherapy;Scar mobilization;Patient/family education;Passive range of motion;Contrast Bath;Moist Heat    Plan  cleared for Agressive ROM with no limitations per MD, continue yellow putty,  [place]- using care over hypersensitive spot or consider fluido prn, estim,  prn    Consulted and Agree with Plan of Care  Patient       Patient will benefit from skilled therapeutic intervention in order to improve the following deficits and impairments:   Body Structure / Function / Physical Skills: ADL, Edema, Skin integrity, Strength, UE functional use, Flexibility, FMC, Pain, Proprioception, Coordination, Decreased knowledge of precautions, GMC, ROM, Wound, Scar mobility, Dexterity, IADL, Sensation       Visit Diagnosis: Stiffness of right hand, not elsewhere classified  Localized edema  Other lack of coordination  Pain in right hand    Problem List Patient Active Problem List   Diagnosis Date Noted  . Soft tissue swelling   . Dental infection 05/31/2016    Derius Ghosh 06/27/2019, 2:00 PM  [facility] [address] [place], [place], [address] Phone: [phone]   Fax:  [phone]  Name: JOHNEDWARD BRODRICK MRN: [id number] Date of Birth: 1990-12-27

## 2019-06-27 NOTE — Patient Instructions (Signed)
1. Grip Strengthening (Resistive Putty)   Squeeze putty using thumb and all fingers. Repeat  10___ times. Do __2__ sessions per day.   2. Roll putty into tube on table and pinch between ring and small fingers and thumb x 10 reps each. (can do ring and small finger together)     Copyright  VHI. All rights reserved.

## 2019-07-02 ENCOUNTER — Ambulatory Visit: Payer: Worker's Compensation | Attending: Plastic Surgery | Admitting: Occupational Therapy

## 2019-07-02 DIAGNOSIS — M25641 Stiffness of right hand, not elsewhere classified: Secondary | ICD-10-CM | POA: Insufficient documentation

## 2019-07-02 DIAGNOSIS — M79641 Pain in right hand: Secondary | ICD-10-CM | POA: Insufficient documentation

## 2019-07-02 DIAGNOSIS — R278 Other lack of coordination: Secondary | ICD-10-CM | POA: Insufficient documentation

## 2019-07-02 DIAGNOSIS — M6281 Muscle weakness (generalized): Secondary | ICD-10-CM | POA: Insufficient documentation

## 2019-07-02 DIAGNOSIS — R600 Localized edema: Secondary | ICD-10-CM | POA: Insufficient documentation

## 2019-07-04 ENCOUNTER — Ambulatory Visit: Payer: Worker's Compensation | Admitting: Occupational Therapy

## 2019-07-04 ENCOUNTER — Other Ambulatory Visit: Payer: Self-pay

## 2019-07-04 DIAGNOSIS — R600 Localized edema: Secondary | ICD-10-CM | POA: Diagnosis present

## 2019-07-04 DIAGNOSIS — R278 Other lack of coordination: Secondary | ICD-10-CM | POA: Diagnosis present

## 2019-07-04 DIAGNOSIS — M79641 Pain in right hand: Secondary | ICD-10-CM

## 2019-07-04 DIAGNOSIS — M25641 Stiffness of right hand, not elsewhere classified: Secondary | ICD-10-CM

## 2019-07-04 DIAGNOSIS — M6281 Muscle weakness (generalized): Secondary | ICD-10-CM | POA: Diagnosis present

## 2019-07-04 NOTE — Therapy (Signed)
[facility] [address] [place], [place], [address] Phone: [phone]   Fax:  [phone]  Occupational Therapy Treatment  Patient Details  Name: TALOR CHEEMA MRN: [id number] Date of Birth: [birth year] Referring Provider (OT): Dr. Claudia Desanctis   Encounter Date: 07/04/2019   OT End of Session - 07/04/19 1113    Visit Number 12    Number of Visits 25    Date for OT Re-Evaluation 07/29/19    Authorization Type worker's comp    Authorization Time Period additional 12 visits approved    Authorization - Visit Number 12    Authorization - Number of Visits 24    OT Start Time 1093    OT Stop Time 1143    OT Time Calculation (min) 40 min    Activity Tolerance Patient tolerated treatment well           Past Medical History:  Diagnosis Date  . Hypertension     Past Surgical History:  Procedure Laterality Date  . I & D EXTREMITY Right 04/10/2019   Procedure: IRRIGATION AND DEBRIDEMENT OF RIGHT LONG AND RING FINGERS .;  Surgeon: Cindra Presume, MD;  Location: [facility];  Service: Plastics;  Laterality: Right;  . PERCUTANEOUS PINNING Right 04/10/2019   Procedure: PERCUTANEOUS PINNING RIGHT LONG AND RING FINGERS.;  Surgeon: Cindra Presume, MD;  Location: [facility];  Service: Plastics;  Laterality: Right;    There were no vitals filed for this visit.   Subjective Assessment - 07/04/19 1114    Subjective  Pt rpeorts finger still has hypersensitive spot    Pertinent History 29 y.o. male s/p  severe crush injury that occurred at work.  A metal beam fell on his right ring and long fingers.  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  The ring finger crush was through the level of the middle phalanx and was a near amputation.  Pt underwent I and D right and long fingers, Open reduction internal fixation of right ring finger middle phalanx fracture, Repair of right ring finger FDP using a slip of the FDS as a tendon  graft,  Repair of right ring finger extensor tendon, Reconstruction of right ring finger radial digital nerve with nerve tube, Revision amputation of right long finger    Limitations 06/26/19- pt cleared for more agressive therapy with no limits per Dr Claudia Desanctis    Special Tests date of surgery 04/10/19    Patient Stated Goals regain use of RUE    Currently in Pain? Yes    Pain Score 5     Pain Location Finger (Comment which one)    Pain Orientation Right    Pain Descriptors / Indicators Aching    Pain Type Acute pain    Pain Onset More than a month ago    Pain Frequency Intermittent    Aggravating Factors  touching incision spot    Pain Relieving Factors heat                  Treatment: Fluidotherapy to RUE x 10 mins, for stiffness and desensitization, no adverse reactions. A/ROM and P/ROM composite finger flexion followed by place and hold, reverse blocking exercises Reviewed yellow theraputty exercises for sustained grip and pinch. NMES 50 pps, 250 pw, 10 secs cycle, intensity 10 to finger flexors, with pt perfroming finger flexion during on cycle x 10 mins no adverse reactions.  OT Short Term Goals - 06/27/19 1139      OT SHORT TERM GOAL #1   Title I with inital  HEP(once cleared by MD for A/ROM.    Time 6    Period Weeks    Status Achieved    Target Date 06/14/19      OT SHORT TERM GOAL #2   Title I with splint wear, care and precautions    Time 6    Period Weeks    Status Achieved      OT SHORT TERM GOAL #3   Title I with dressing changes/ hand hygiene    Time 6    Period Weeks    Status Achieved      OT SHORT TERM GOAL #4   Title Pt will demonstrate at least 50% composite finger flexion for RUE.    Time 6    Period Weeks    Status Achieved   RUE ring finger PIP 55, MP 85, no A/ROM at DIP            OT Long Term Goals - 06/27/19 1139      OT LONG TERM GOAL #1   Title Pt will demonstrate understanding of updated HEP.    Time  12    Period Weeks    Status New      OT LONG TERM GOAL #2   Title Pt will resume use of RUE as a dominant hand at least 90% of the time with pain less than or equal to 3/10    Time 12    Period Weeks    Status On-going   uses RUE 5-10% of the time     OT LONG TERM GOAL #3   Title Pt will demonstrate  grip strength of at least 30 lbs for RUE functional use.    Baseline 06/27/19 22.4 lbs    Time 12    Period Weeks    Status New   grip 22.4 lbs     OT LONG TERM GOAL #4   Title Pt will demonstrate at least 90% composite finger flexion for RUE.    Time 12    Period Weeks    Status New   grossly 75-80%     OT LONG TERM GOAL #5   Title Pt will perfrom simulated work activities modified independently    Time 12    Period Weeks    Status On-going                 Plan - 07/04/19 1139    Clinical Impression Statement Pt is progressing towards goals. Pt demonstrates understanding of yellow theraputty HEP. Pt was approved for 12 additional visits by worker's comp.    Occupational performance deficits (Please refer to evaluation for details): ADL's;IADL's;Rest and Sleep;Work;Play;Leisure;Social Participation    Body Structure / Function / Physical Skills ADL;Edema;Skin integrity;Strength;UE functional use;Flexibility;FMC;Pain;Proprioception;Coordination;Decreased knowledge of precautions;GMC;ROM;Wound;Scar mobility;Dexterity;IADL;Sensation    Rehab Potential Good    OT Frequency 2x / week    OT Duration 12 weeks    OT Treatment/Interventions Self-care/ADL training;Therapeutic exercise;Splinting;Manual Therapy;Neuromuscular education;Ultrasound;Therapeutic activities;DME and/or AE instruction;Paraffin;Cryotherapy;Electrical Stimulation;Fluidtherapy;Scar mobilization;Patient/family education;Passive range of motion;Contrast Bath;Moist Heat    Plan cleared for Agressive ROM with no limitations per MD, continue yellow putty,  [place]- using care over hypersensitive spot or consider fluido prn,  estim,  prn    Consulted and Agree with Plan of Care Patient           Patient will benefit from skilled therapeutic  intervention in order to improve the following deficits and impairments:   Body Structure / Function / Physical Skills: ADL, Edema, Skin integrity, Strength, UE functional use, Flexibility, FMC, Pain, Proprioception, Coordination, Decreased knowledge of precautions, GMC, ROM, Wound, Scar mobility, Dexterity, IADL, Sensation       Visit Diagnosis: Stiffness of right hand, not elsewhere classified  Localized edema  Other lack of coordination  Pain in right hand  Muscle weakness (generalized)    Problem List Patient Active Problem List   Diagnosis Date Noted  . Soft tissue swelling   . Dental infection 05/31/2016    Shelitha Magley 07/04/2019, 11:41 AM Keene Breath, OTR/L [phone] Phone: [phone] 11:41 AM 07/04/19 [facility] [address] [place], [place], [address] Phone: [phone]   Fax:  [phone]  Name: AZEKIEL CREMER MRN: [id number] Date of Birth: September 19, 1990

## 2019-07-07 ENCOUNTER — Other Ambulatory Visit: Payer: Self-pay

## 2019-07-07 ENCOUNTER — Ambulatory Visit: Payer: Worker's Compensation | Attending: Plastic Surgery | Admitting: Occupational Therapy

## 2019-07-07 DIAGNOSIS — R278 Other lack of coordination: Secondary | ICD-10-CM

## 2019-07-07 DIAGNOSIS — R600 Localized edema: Secondary | ICD-10-CM | POA: Diagnosis present

## 2019-07-07 DIAGNOSIS — M25641 Stiffness of right hand, not elsewhere classified: Secondary | ICD-10-CM | POA: Insufficient documentation

## 2019-07-07 DIAGNOSIS — M6281 Muscle weakness (generalized): Secondary | ICD-10-CM | POA: Insufficient documentation

## 2019-07-07 DIAGNOSIS — M79641 Pain in right hand: Secondary | ICD-10-CM | POA: Insufficient documentation

## 2019-07-07 NOTE — Therapy (Signed)
[facility] [address] [place], [place], [address] Phone: [phone]   Fax:  [phone]  Occupational Therapy Treatment  Patient Details  Name: Marc Hebert MRN: [id number] Date of Birth: 1990-07-31 Referring Provider (OT): Dr. Arita Miss   Encounter Date: 07/07/2019   OT End of Session - 07/07/19 0945    Visit Number 13    Number of Visits 25    Date for OT Re-Evaluation 07/29/19    Authorization Type worker's comp    Authorization Time Period additional 12 visits approved    Authorization - Visit Number 13    Authorization - Number of Visits 24    OT Start Time 0933    OT Stop Time 1020    OT Time Calculation (min) 47 min    Activity Tolerance Patient tolerated treatment well    Behavior During Therapy [facility] for tasks assessed/performed           Past Medical History:  Diagnosis Date  . Hypertension     Past Surgical History:  Procedure Laterality Date  . I & D EXTREMITY Right 04/10/2019   Procedure: IRRIGATION AND DEBRIDEMENT OF RIGHT LONG AND RING FINGERS .;  Surgeon: Allena Napoleon, MD;  [facility];  Service: Plastics;  Laterality: Right;  . PERCUTANEOUS PINNING Right 04/10/2019   Procedure: PERCUTANEOUS PINNING RIGHT LONG AND RING FINGERS.;  Surgeon: Allena Napoleon, MD;  [facility];  Service: Plastics;  Laterality: Right;    There were no vitals filed for this visit.   Subjective Assessment - 07/07/19 0938    Subjective  It's getting better    Pertinent History 29 y.o. male s/p  severe crush injury that occurred at work.  A metal beam fell on his right ring and long fingers.  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  The ring finger crush was through the level of the middle phalanx and was a near amputation.  Pt underwent I and D right and long fingers, Open reduction internal fixation of right ring finger middle phalanx fracture, Repair of right ring finger FDP using a  slip of the FDS as a tendon graft,  Repair of right ring finger extensor tendon, Reconstruction of right ring finger radial digital nerve with nerve tube, Revision amputation of right long finger    Limitations 06/26/19- pt cleared for more agressive therapy with no limits per Dr Arita Miss    Special Tests date of surgery 04/10/19    Patient Stated Goals regain use of RUE    Currently in Pain? Yes    Pain Score 3     Pain Location Finger (Comment which one)   RING   Pain Orientation Right    Pain Descriptors / Indicators Aching    Pain Type Acute pain    Pain Onset More than a month ago    Pain Frequency Intermittent    Aggravating Factors  touching incision spot    Pain Relieving Factors heat             Treatment: Fluidotherapy to RUE x 12 mins, for stiffness and desensitization, no adverse reactions. A/ROM and P/ROM composite finger flexion followed by place and hold, reverse blocking exercises Gripper set at level 2 resistance to pick up blocks for sustained grip strength Rt hand. Pt issued finger strap to increase PIP and DIP flexion Rt ring finger to wear for 5 minutes at a time several times per day - will likely  tighten to increase stretch next session NMES 50 pps, 250 pw, 10 secs cycle, intensity 15 to finger flexors, with pt perfroming finger flexion during on cycle x 10 mins no adverse reactions.                       OT Short Term Goals - 06/27/19 1139      OT SHORT TERM GOAL #1   Title I with inital  HEP(once cleared by MD for A/ROM.    Time 6    Period Weeks    Status Achieved    Target Date 06/14/19      OT SHORT TERM GOAL #2   Title I with splint wear, care and precautions    Time 6    Period Weeks    Status Achieved      OT SHORT TERM GOAL #3   Title I with dressing changes/ hand hygiene    Time 6    Period Weeks    Status Achieved      OT SHORT TERM GOAL #4   Title Pt will demonstrate at least 50% composite finger flexion for RUE.    Time  6    Period Weeks    Status Achieved   RUE ring finger PIP 55, MP 85, no A/ROM at DIP            OT Long Term Goals - 07/07/19 0948      OT LONG TERM GOAL #1   Title Pt will demonstrate understanding of updated HEP.    Time 12    Period Weeks    Status On-going      OT LONG TERM GOAL #2   Title Pt will resume use of RUE as a dominant hand at least 90% of the time with pain less than or equal to 3/10    Time 12    Period Weeks    Status On-going   uses RUE 5-10% of the time     OT LONG TERM GOAL #3   Title Pt will demonstrate  grip strength of at least 30 lbs for RUE functional use.    Baseline 06/27/19 22.4 lbs    Time 12    Period Weeks    Status On-going   grip 22.4 lbs     OT LONG TERM GOAL #4   Title Pt will demonstrate at least 90% composite finger flexion for RUE.    Time 12    Period Weeks    Status On-going   grossly 75-80%     OT LONG TERM GOAL #5   Title Pt will perfrom simulated work activities modified independently    Time 12    Period Weeks    Status On-going                 Plan - 07/07/19 0946    Clinical Impression Statement Pt progressing towards goals.    Occupational performance deficits (Please refer to evaluation for details): ADL's;IADL's;Rest and Sleep;Work;Play;Leisure;Social Participation    Body Structure / Function / Physical Skills ADL;Edema;Skin integrity;Strength;UE functional use;Flexibility;FMC;Pain;Proprioception;Coordination;Decreased knowledge of precautions;GMC;ROM;Wound;Scar mobility;Dexterity;IADL;Sensation    Rehab Potential Good    OT Frequency 2x / week    OT Duration 12 weeks    OT Treatment/Interventions Self-care/ADL training;Therapeutic exercise;Splinting;Manual Therapy;Neuromuscular education;Ultrasound;Therapeutic activities;DME and/or AE instruction;Paraffin;Cryotherapy;Electrical Stimulation;Fluidtherapy;Scar mobilization;Patient/family education;Passive range of motion;Contrast Bath;Moist Heat    Plan continue  aggressive ROM, strengthening, fluidotherapy or [place], adjustments to finger strap prn, increase to red resistance  putty if tolerated   Consulted and Agree with Plan of Care Patient           Patient will benefit from skilled therapeutic intervention in order to improve the following deficits and impairments:   Body Structure / Function / Physical Skills: ADL, Edema, Skin integrity, Strength, UE functional use, Flexibility, FMC, Pain, Proprioception, Coordination, Decreased knowledge of precautions, GMC, ROM, Wound, Scar mobility, Dexterity, IADL, Sensation       Visit Diagnosis: Stiffness of right hand, not elsewhere classified  Localized edema  Other lack of coordination  Pain in right hand  Muscle weakness (generalized)    Problem List Patient Active Problem List   Diagnosis Date Noted  . Soft tissue swelling   . Dental infection 05/31/2016    Carey Bullocks, OTR/L 07/07/2019, 10:07 AM  [facility] [address] [place], [place], [address] Phone: [phone]   Fax:  [phone]  Name: Marc Hebert MRN: [id number] Date of Birth: 1990-02-12

## 2019-07-09 ENCOUNTER — Other Ambulatory Visit: Payer: Self-pay

## 2019-07-09 ENCOUNTER — Ambulatory Visit: Payer: Worker's Compensation | Admitting: Occupational Therapy

## 2019-07-09 DIAGNOSIS — M25641 Stiffness of right hand, not elsewhere classified: Secondary | ICD-10-CM

## 2019-07-09 DIAGNOSIS — M6281 Muscle weakness (generalized): Secondary | ICD-10-CM

## 2019-07-09 DIAGNOSIS — R278 Other lack of coordination: Secondary | ICD-10-CM

## 2019-07-09 DIAGNOSIS — M79641 Pain in right hand: Secondary | ICD-10-CM

## 2019-07-09 NOTE — Therapy (Signed)
[facility] [address] [place], [place], [address] Phone: [phone]   Fax:  [phone]  Occupational Therapy Treatment  Patient Details  Name: Marc Hebert MRN: [id number] Date of Birth: 1990/05/03 Referring Provider (OT): Dr. Claudia Desanctis   Encounter Date: 07/09/2019   OT End of Session - 07/09/19 1450    Visit Number 14    Number of Visits 25    Date for OT Re-Evaluation 07/29/19    Authorization Type worker's comp    Authorization Time Period additional 12 visits approved    Authorization - Visit Number 14    Authorization - Number of Visits 24    OT Start Time 0933    OT Stop Time 1015    OT Time Calculation (min) 42 min    Activity Tolerance Patient tolerated treatment well    Behavior During Therapy [facility] for tasks assessed/performed           Past Medical History:  Diagnosis Date  . Hypertension     Past Surgical History:  Procedure Laterality Date  . I & D EXTREMITY Right 04/10/2019   Procedure: IRRIGATION AND DEBRIDEMENT OF RIGHT LONG AND RING FINGERS .;  Surgeon: Cindra Presume, MD;  Location: [facility];  Service: Plastics;  Laterality: Right;  . PERCUTANEOUS PINNING Right 04/10/2019   Procedure: PERCUTANEOUS PINNING RIGHT LONG AND RING FINGERS.;  Surgeon: Cindra Presume, MD;  Location: [facility];  Service: Plastics;  Laterality: Right;    There were no vitals filed for this visit.   Subjective Assessment - 07/09/19 0938    Subjective  The little finger strap you made helps some, but I don't need it tighter yet.    Pertinent History 29 y.o. male s/p  severe crush injury that occurred at work.  A metal beam fell on his right ring and long fingers.  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  The ring finger crush was through the level of the middle phalanx and was a near amputation.  Pt underwent I and D right and long fingers, Open reduction internal fixation of right ring finger middle  phalanx fracture, Repair of right ring finger FDP using a slip of the FDS as a tendon graft,  Repair of right ring finger extensor tendon, Reconstruction of right ring finger radial digital nerve with nerve tube, Revision amputation of right long finger    Limitations 06/26/19- pt cleared for more agressive therapy with no limits per Dr Claudia Desanctis    Special Tests date of surgery 04/10/19    Patient Stated Goals regain use of RUE    Currently in Pain? No/denies          Fluidotherapy x 12 min Rt hand for stiffness and desensitization.  Continued aggressive AROM, place and hold, and P/ROM in blocking, reverse blocking, and tendon gliding ex's. Upgraded to red resistance putty for grip strength and added IP flexion ex with putty but using yellow resistance for this. Pt able to tolerate pinch strength with red resistance putty. Pt sore at end of session w/ soreness 5/10                        OT Short Term Goals - 06/27/19 1139      OT SHORT TERM GOAL #1   Title I with inital  HEP(once cleared by MD for A/ROM.    Time 6    Period Weeks    Status  Achieved    Target Date 06/14/19      OT SHORT TERM GOAL #2   Title I with splint wear, care and precautions    Time 6    Period Weeks    Status Achieved      OT SHORT TERM GOAL #3   Title I with dressing changes/ hand hygiene    Time 6    Period Weeks    Status Achieved      OT SHORT TERM GOAL #4   Title Pt will demonstrate at least 50% composite finger flexion for RUE.    Time 6    Period Weeks    Status Achieved   RUE ring finger PIP 55, MP 85, no A/ROM at DIP            OT Long Term Goals - 07/07/19 0948      OT LONG TERM GOAL #1   Title Pt will demonstrate understanding of updated HEP.    Time 12    Period Weeks    Status On-going      OT LONG TERM GOAL #2   Title Pt will resume use of RUE as a dominant hand at least 90% of the time with pain less than or equal to 3/10    Time 12    Period Weeks    Status  On-going   uses RUE 5-10% of the time     OT LONG TERM GOAL #3   Title Pt will demonstrate  grip strength of at least 30 lbs for RUE functional use.    Baseline 06/27/19 22.4 lbs    Time 12    Period Weeks    Status On-going   grip 22.4 lbs     OT LONG TERM GOAL #4   Title Pt will demonstrate at least 90% composite finger flexion for RUE.    Time 12    Period Weeks    Status On-going   grossly 75-80%     OT LONG TERM GOAL #5   Title Pt will perfrom simulated work activities modified independently    Time 12    Period Weeks    Status On-going                 Plan - 07/09/19 1451    Clinical Impression Statement Pt progressing towards goals.    Occupational performance deficits (Please refer to evaluation for details): ADL's;IADL's;Rest and Sleep;Work;Play;Leisure;Social Participation    Body Structure / Function / Physical Skills ADL;Edema;Skin integrity;Strength;UE functional use;Flexibility;FMC;Pain;Proprioception;Coordination;Decreased knowledge of precautions;GMC;ROM;Wound;Scar mobility;Dexterity;IADL;Sensation    Rehab Potential Good    OT Frequency 2x / week    OT Duration 12 weeks    OT Treatment/Interventions Self-care/ADL training;Therapeutic exercise;Splinting;Manual Therapy;Neuromuscular education;Ultrasound;Therapeutic activities;DME and/or AE instruction;Paraffin;Cryotherapy;Electrical Stimulation;Fluidtherapy;Scar mobilization;Patient/family education;Passive range of motion;Contrast Bath;Moist Heat    Plan continue aggressive ROM, strengthening, fluidotherapy    Consulted and Agree with Plan of Care Patient           Patient will benefit from skilled therapeutic intervention in order to improve the following deficits and impairments:   Body Structure / Function / Physical Skills: ADL, Edema, Skin integrity, Strength, UE functional use, Flexibility, FMC, Pain, Proprioception, Coordination, Decreased knowledge of precautions, GMC, ROM, Wound, Scar mobility,  Dexterity, IADL, Sensation       Visit Diagnosis: Stiffness of right hand, not elsewhere classified  Other lack of coordination  Pain in right hand  Muscle weakness (generalized)    Problem List Patient Active Problem List  Diagnosis Date Noted  . Soft tissue swelling   . Dental infection 05/31/2016    Kelli Churn, OTR/L 07/09/2019, 2:52 PM  [facility] [address] [place], [place], [address] Phone: [phone]   Fax:  [phone]  Name: Marc Hebert MRN: [id number] Date of Birth: 1990-04-30

## 2019-07-15 ENCOUNTER — Ambulatory Visit: Payer: Worker's Compensation | Admitting: Occupational Therapy

## 2019-07-15 ENCOUNTER — Other Ambulatory Visit: Payer: Self-pay

## 2019-07-15 DIAGNOSIS — M25641 Stiffness of right hand, not elsewhere classified: Secondary | ICD-10-CM

## 2019-07-15 DIAGNOSIS — M6281 Muscle weakness (generalized): Secondary | ICD-10-CM

## 2019-07-15 DIAGNOSIS — R278 Other lack of coordination: Secondary | ICD-10-CM

## 2019-07-15 NOTE — Therapy (Signed)
[facility] [address] [place], [place], [address] Phone: [phone]   Fax:  [phone]  Occupational Therapy Treatment  Patient Details  Name: Marc Hebert MRN: [id number] Date of Birth: [birth year] Referring Provider (OT): Dr. Arita Miss   Encounter Date: 07/15/2019   OT End of Session - 07/15/19 1012    Visit Number 15    Number of Visits 25    Date for OT Re-Evaluation 07/29/19    Authorization Type worker's comp    Authorization Time Period additional 12 visits approved    Authorization - Visit Number 15    Authorization - Number of Visits 24    OT Start Time 0930    OT Stop Time 1017    OT Time Calculation (min) 47 min    Activity Tolerance Patient tolerated treatment well    Behavior During Therapy [facility] for tasks assessed/performed           Past Medical History:  Diagnosis Date  . Hypertension     Past Surgical History:  Procedure Laterality Date  . I & D EXTREMITY Right 04/10/2019   Procedure: IRRIGATION AND DEBRIDEMENT OF RIGHT LONG AND RING FINGERS .;  Surgeon: Allena Napoleon, MD;  [facility]: [facility];  Service: Plastics;  Laterality: Right;  . PERCUTANEOUS PINNING Right 04/10/2019   Procedure: PERCUTANEOUS PINNING RIGHT LONG AND RING FINGERS.;  Surgeon: Allena Napoleon, MD;  Location: [facility];  Service: Plastics;  Laterality: Right;    There were no vitals filed for this visit.   Subjective Assessment - 07/15/19 0942    Subjective  Not really pain, just stiffness and that one spot is still a little sore    Pertinent History 29 y.o. male s/p  severe crush injury that occurred at work.  A metal beam fell on his right ring and long fingers.  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  The ring finger crush was through the level of the middle phalanx and was a near amputation.  Pt underwent I and D right and long fingers, Open reduction internal fixation of right ring finger middle  phalanx fracture, Repair of right ring finger FDP using a slip of the FDS as a tendon graft,  Repair of right ring finger extensor tendon, Reconstruction of right ring finger radial digital nerve with nerve tube, Revision amputation of right long finger    Limitations 06/26/19- pt cleared for more agressive therapy with no limits per Dr Arita Miss    Special Tests date of surgery 04/10/19    Patient Stated Goals regain use of RUE    Currently in Pain? No/denies                        OT Treatments/Exercises (OP) - 07/15/19 0001      ADLs   ADL Comments Made IP flexion strap tighter today as pt no longer feels stretch w/ previous size.      Exercises   Exercises Hand      Hand Exercises   Other Hand Exercises P/ROM and place and hold ex's for Rt ring finger. Putty ex's for grip and IP flexion      Modalities   Modalities Fluidotherapy;Air cabin crew Action finger flexion    Electrical Stimulation Parameters 50 pps, 250 pw, 10 sec on/off cycle, Int = 14 x  10 minutes total    Electrical Stimulation Goals --   ROM     RUE Fluidotherapy   Number Minutes Fluidotherapy 10 Minutes    RUE Fluidotherapy Location Hand    Comments to decrease stiffness                    OT Short Term Goals - 06/27/19 1139      OT SHORT TERM GOAL #1   Title I with inital  HEP(once cleared by MD for A/ROM.    Time 6    Period Weeks    Status Achieved    Target Date 06/14/19      OT SHORT TERM GOAL #2   Title I with splint wear, care and precautions    Time 6    Period Weeks    Status Achieved      OT SHORT TERM GOAL #3   Title I with dressing changes/ hand hygiene    Time 6    Period Weeks    Status Achieved      OT SHORT TERM GOAL #4   Title Pt will demonstrate at least 50% composite finger flexion for RUE.    Time 6    Period Weeks    Status Achieved   RUE ring  finger PIP 55, MP 85, no A/ROM at DIP            OT Long Term Goals - 07/15/19 0944      OT LONG TERM GOAL #1   Title Pt will demonstrate understanding of updated HEP.    Time 12    Period Weeks    Status Achieved      OT LONG TERM GOAL #2   Title Pt will resume use of RUE as a dominant hand at least 90% of the time with pain less than or equal to 3/10    Time 12    Period Weeks    Status On-going   uses RUE 5-10% of the time     OT LONG TERM GOAL #3   Title Pt will demonstrate  grip strength of at least 30 lbs for RUE functional use.    Baseline 06/27/19 22.4 lbs    Time 12    Period Weeks    Status On-going   grip 22.4 lbs     OT LONG TERM GOAL #4   Title Pt will demonstrate at least 90% composite finger flexion for RUE.    Time 12    Period Weeks    Status On-going   grossly 75-80%     OT LONG TERM GOAL #5   Title Pt will perfrom simulated work activities modified independently    Time [place] - 07/15/19 0944    Clinical Impression Statement Pt progressing towards goals. Pt met LTG #1    Occupational performance deficits (Please refer to evaluation for details): ADL's;IADL's;Rest and Sleep;Work;Play;Leisure;Social Participation    Body Structure / Function / Physical Skills ADL;Edema;Skin integrity;Strength;UE functional use;Flexibility;FMC;Pain;Proprioception;Coordination;Decreased knowledge of precautions;GMC;ROM;Wound;Scar mobility;Dexterity;IADL;Sensation    Rehab Potential Good    OT Frequency 2x / week    OT Duration 12 weeks    OT Treatment/Interventions Self-care/ADL training;Therapeutic exercise;Splinting;Manual Therapy;Neuromuscular education;Ultrasound;Therapeutic activities;DME and/or AE instruction;Paraffin;Cryotherapy;Electrical Stimulation;Fluidtherapy;Scar mobilization;Patient/family education;Passive range of motion;Contrast Bath;Moist Heat    Plan continue aggressive ROM, strengthening,  fluidotherapy  Consulted and Agree with Plan of Care Patient           Patient will benefit from skilled therapeutic intervention in order to improve the following deficits and impairments:   Body Structure / Function / Physical Skills: ADL, Edema, Skin integrity, Strength, UE functional use, Flexibility, FMC, Pain, Proprioception, Coordination, Decreased knowledge of precautions, GMC, ROM, Wound, Scar mobility, Dexterity, IADL, Sensation       Visit Diagnosis: Stiffness of right hand, not elsewhere classified  Other lack of coordination  Muscle weakness (generalized)    Problem List Patient Active Problem List   Diagnosis Date Noted  . Soft tissue swelling   . Dental infection 05/31/2016    Carey Bullocks, OTR/L 07/15/2019, 10:14 AM  [facility] [address] [place], [place], [address] Phone: [phone]   Fax:  [phone]  Name: Marc Hebert MRN: [id number] Date of Birth: 09-06-1990

## 2019-07-17 ENCOUNTER — Ambulatory Visit: Payer: Worker's Compensation | Admitting: Occupational Therapy

## 2019-07-22 ENCOUNTER — Other Ambulatory Visit: Payer: Self-pay

## 2019-07-22 ENCOUNTER — Ambulatory Visit: Payer: Worker's Compensation | Attending: Plastic Surgery | Admitting: Occupational Therapy

## 2019-07-22 DIAGNOSIS — R600 Localized edema: Secondary | ICD-10-CM | POA: Diagnosis present

## 2019-07-22 DIAGNOSIS — M25641 Stiffness of right hand, not elsewhere classified: Secondary | ICD-10-CM | POA: Insufficient documentation

## 2019-07-22 DIAGNOSIS — M79641 Pain in right hand: Secondary | ICD-10-CM | POA: Insufficient documentation

## 2019-07-22 DIAGNOSIS — M6281 Muscle weakness (generalized): Secondary | ICD-10-CM | POA: Insufficient documentation

## 2019-07-22 DIAGNOSIS — R278 Other lack of coordination: Secondary | ICD-10-CM | POA: Diagnosis present

## 2019-07-22 NOTE — Therapy (Signed)
[facility] [address] [place], [place], [address] Phone: [phone]   Fax:  [phone]  Occupational Therapy Treatment  Patient Details  Name: Marc Hebert MRN: [id number] Date of Birth: Mar 31, 1990 Referring Provider (OT): Dr. Arita Miss   Encounter Date: 07/22/2019   OT End of Session - 07/22/19 1007    Visit Number 16    Number of Visits 25    Date for OT Re-Evaluation 07/29/19    Authorization Type worker's comp    Authorization Time Period additional 12 visits approved    Authorization - Visit Number 16    Authorization - Number of Visits 24    OT Start Time 0932    OT Stop Time 1017    OT Time Calculation (min) 45 min    Activity Tolerance Patient tolerated treatment well    Behavior During Therapy [facility] for tasks assessed/performed           Past Medical History:  Diagnosis Date  . Hypertension     Past Surgical History:  Procedure Laterality Date  . I & D EXTREMITY Right 04/10/2019   Procedure: IRRIGATION AND DEBRIDEMENT OF RIGHT LONG AND RING FINGERS .;  Surgeon: Allena Napoleon, MD;  [facility]: [facility];  Service: Plastics;  Laterality: Right;  . PERCUTANEOUS PINNING Right 04/10/2019   Procedure: PERCUTANEOUS PINNING RIGHT LONG AND RING FINGERS.;  Surgeon: Allena Napoleon, MD;  [facility];  Service: Plastics;  Laterality: Right;    There were no vitals filed for this visit.   Subjective Assessment - 07/22/19 0934    Pertinent History 29 y.o. male s/p  severe crush injury that occurred at work.  A metal beam fell on his right ring and long fingers.  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  The ring finger crush was through the level of the middle phalanx and was a near amputation.  Pt underwent I and D right and long fingers, Open reduction internal fixation of right ring finger middle phalanx fracture, Repair of right ring finger FDP using a slip of the FDS as a tendon graft,   Repair of right ring finger extensor tendon, Reconstruction of right ring finger radial digital nerve with nerve tube, Revision amputation of right long finger    Limitations 06/26/19- pt cleared for more agressive therapy with no limits per Dr Arita Miss    Special Tests date of surgery 04/10/19    Patient Stated Goals regain use of RUE    Currently in Pain? Yes    Pain Score 4    4/10 ring finger, 6/10 at tip of amputation long finger   Pain Orientation Right    Pain Descriptors / Indicators Sore    Pain Type Acute pain    Pain Onset More than a month ago    Pain Frequency Constant    Aggravating Factors  gripping something    Pain Relieving Factors heat                        OT Treatments/Exercises (OP) - 07/22/19 0001      Hand Exercises   Other Hand Exercises P/ROM, Place and hold, and A/ROM in blocking, reverse blocking, and full composite flexion. P/ROM in ring finger extension    Other Hand Exercises Gripper set at level 3 resistance to pick up blocks Rt hand for sustained grip strength      Electrical Stimulation   Electrical Stimulation  Location [place] forearm    Electrical Stimulation Action finger flexion    Electrical Stimulation Parameters 50 pps, 250 pw, 10 sec on/off cycle x 10 minutes total    Electrical Stimulation Goals --   ROM     RUE Fluidotherapy   Number Minutes Fluidotherapy 10 Minutes    RUE Fluidotherapy Location Hand    Comments to decrease stiffness and pain                    OT Short Term Goals - 06/27/19 1139      OT SHORT TERM GOAL #1   Title I with inital  HEP(once cleared by MD for A/ROM.    Time 6    Period Weeks    Status Achieved    Target Date 06/14/19      OT SHORT TERM GOAL #2   Title I with splint wear, care and precautions    Time 6    Period Weeks    Status Achieved      OT SHORT TERM GOAL #3   Title I with dressing changes/ hand hygiene    Time 6    Period Weeks    Status Achieved      OT SHORT TERM  GOAL #4   Title Pt will demonstrate at least 50% composite finger flexion for RUE.    Time 6    Period Weeks    Status Achieved   RUE ring finger PIP 55, MP 85, no A/ROM at DIP            OT Long Term Goals - 07/15/19 0944      OT LONG TERM GOAL #1   Title Pt will demonstrate understanding of updated HEP.    Time 12    Period Weeks    Status Achieved      OT LONG TERM GOAL #2   Title Pt will resume use of RUE as a dominant hand at least 90% of the time with pain less than or equal to 3/10    Time 12    Period Weeks    Status On-going   uses RUE 5-10% of the time     OT LONG TERM GOAL #3   Title Pt will demonstrate  grip strength of at least 30 lbs for RUE functional use.    Baseline 06/27/19 22.4 lbs    Time 12    Period Weeks    Status On-going   grip 22.4 lbs     OT LONG TERM GOAL #4   Title Pt will demonstrate at least 90% composite finger flexion for RUE.    Time 12    Period Weeks    Status On-going   grossly 75-80%     OT LONG TERM GOAL #5   Title Pt will perfrom simulated work activities modified independently    Time 12    Period Weeks    Status On-going                 Plan - 07/22/19 1008    Clinical Impression Statement Pt progressing towards goals. Pt with increased soreness today.    Occupational performance deficits (Please refer to evaluation for details): ADL's;IADL's;Rest and Sleep;Work;Play;Leisure;Social Participation    Body Structure / Function / Physical Skills ADL;Edema;Skin integrity;Strength;UE functional use;Flexibility;FMC;Pain;Proprioception;Coordination;Decreased knowledge of precautions;GMC;ROM;Wound;Scar mobility;Dexterity;IADL;Sensation    Rehab Potential Good    OT Frequency 2x / week    OT Duration 12 weeks    OT Treatment/Interventions Self-care/ADL  training;Therapeutic exercise;Splinting;Manual Therapy;Neuromuscular education;Ultrasound;Therapeutic activities;DME and/or AE instruction;Paraffin;Cryotherapy;Electrical  Stimulation;Fluidtherapy;Scar mobilization;Patient/family education;Passive range of motion;Contrast Bath;Moist Heat    Plan continue aggressive ROM, strengthening, fluidotherapy    Consulted and Agree with Plan of Care Patient           Patient will benefit from skilled therapeutic intervention in order to improve the following deficits and impairments:   Body Structure / Function / Physical Skills: ADL, Edema, Skin integrity, Strength, UE functional use, Flexibility, FMC, Pain, Proprioception, Coordination, Decreased knowledge of precautions, GMC, ROM, Wound, Scar mobility, Dexterity, IADL, Sensation       Visit Diagnosis: Stiffness of right hand, not elsewhere classified  Muscle weakness (generalized)  Pain in right hand  Localized edema  Other lack of coordination    Problem List Patient Active Problem List   Diagnosis Date Noted  . Soft tissue swelling   . Dental infection 05/31/2016    Kelli Churn, OTR/L 07/22/2019, 10:09 AM  [facility] [address] [place], [place], [address] Phone: [phone]   Fax:  [phone]  Name: Marc Hebert MRN: [id number] Date of Birth: [birth year]

## 2019-07-30 ENCOUNTER — Ambulatory Visit: Payer: Worker's Compensation | Attending: Plastic Surgery | Admitting: Occupational Therapy

## 2019-07-30 ENCOUNTER — Encounter: Payer: Self-pay | Admitting: Occupational Therapy

## 2019-07-30 ENCOUNTER — Other Ambulatory Visit: Payer: Self-pay

## 2019-07-30 DIAGNOSIS — R600 Localized edema: Secondary | ICD-10-CM | POA: Insufficient documentation

## 2019-07-30 DIAGNOSIS — M79641 Pain in right hand: Secondary | ICD-10-CM | POA: Insufficient documentation

## 2019-07-30 DIAGNOSIS — M6281 Muscle weakness (generalized): Secondary | ICD-10-CM | POA: Insufficient documentation

## 2019-07-30 DIAGNOSIS — M25641 Stiffness of right hand, not elsewhere classified: Secondary | ICD-10-CM | POA: Diagnosis not present

## 2019-07-30 DIAGNOSIS — R278 Other lack of coordination: Secondary | ICD-10-CM | POA: Insufficient documentation

## 2019-07-30 NOTE — Therapy (Signed)
[facility] [address] [place], [place], [address] Phone: [phone]   Fax:  [phone]  Occupational Therapy Treatment  Patient Details  Name: Marc Hebert MRN: [id number] Date of Birth: 1990/04/03 Referring Provider (OT): Dr. Arita Miss   Encounter Date: 07/30/2019   OT End of Session - 07/30/19 0942    Visit Number 17    Number of Visits 25    Date for OT Re-Evaluation 07/29/19    Authorization Type worker's comp    Authorization Time Period additional 12 visits approved    Authorization - Visit Number 17    Authorization - Number of Visits 24    OT Start Time 0934    OT Stop Time 1014    OT Time Calculation (min) 40 min    Activity Tolerance Patient tolerated treatment well    Behavior During Therapy [facility] for tasks assessed/performed           Past Medical History:  Diagnosis Date  . Hypertension     Past Surgical History:  Procedure Laterality Date  . I & D EXTREMITY Right 04/10/2019   Procedure: IRRIGATION AND DEBRIDEMENT OF RIGHT LONG AND RING FINGERS .;  Surgeon: Allena Napoleon, MD;  Location: [facility] OR;  Service: Plastics;  Laterality: Right;  . PERCUTANEOUS PINNING Right 04/10/2019   Procedure: PERCUTANEOUS PINNING RIGHT LONG AND RING FINGERS.;  Surgeon: Allena Napoleon, MD;  Location: [facility] OR;  Service: Plastics;  Laterality: Right;    There were no vitals filed for this visit.   Subjective Assessment - 07/30/19 1011    Subjective  Pt reports increased pain in tip of middle finger    Pertinent History 29 y.o. male s/p  severe crush injury that occurred at work.  A metal beam fell on his right ring and long fingers.  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  The ring finger crush was through the level of the middle phalanx and was a near amputation.  Pt underwent I and D right and long fingers, Open reduction internal fixation of right ring finger middle phalanx fracture, Repair of  right ring finger FDP using a slip of the FDS as a tendon graft,  Repair of right ring finger extensor tendon, Reconstruction of right ring finger radial digital nerve with nerve tube, Revision amputation of right long finger    Limitations 06/26/19- pt cleared for more agressive therapy with no limits per Dr Arita Miss    Special Tests date of surgery 04/10/19    Patient Stated Goals regain use of RUE    Currently in Pain? Yes    Pain Score 6     Pain Location Finger (Comment which one)   middle and ring   Pain Orientation Right    Pain Descriptors / Indicators Aching    Pain Type Surgical pain    Pain Onset More than a month ago    Pain Frequency Constant    Aggravating Factors  exercise    Pain Relieving Factors fluidotherapy                        OT Treatments/Exercises (OP) - 07/30/19 0001      Hand Exercises   Other Hand Exercises Composite P/ROM, Place and hold, and A/ROM in blocking, reverse blocking, and full composite flexion. P/ROM in ring finger PIP flexion/ extension    Other Hand Exercises Gripper set at level 3 resistance to pick up  blocks Rt hand for sustained grip strength min difficulty     Modalities   Modalities Fluidotherapy;Air cabin crew Action finger flexion    Electrical Stimulation Parameters 50  Pps, 250 pw , 10 secs cycle intensity 10   Electrical Stimulation Goals --   ROM     RUE Fluidotherapy   Number Minutes Fluidotherapy 10 Minutes    RUE Fluidotherapy Location Hand    Comments for stiffness and pain relief                    OT Short Term Goals - 06/27/19 1139      OT SHORT TERM GOAL #1   Title I with inital  HEP(once cleared by MD for A/ROM.    Time 6    Period Weeks    Status Achieved    Target Date 06/14/19      OT SHORT TERM GOAL #2   Title I with splint wear, care and precautions    Time 6    Period  Weeks    Status Achieved      OT SHORT TERM GOAL #3   Title I with dressing changes/ hand hygiene    Time 6    Period Weeks    Status Achieved      OT SHORT TERM GOAL #4   Title Pt will demonstrate at least 50% composite finger flexion for RUE.    Time 6    Period Weeks    Status Achieved   RUE ring finger PIP 55, MP 85, no A/ROM at DIP            OT Long Term Goals - 07/30/19 0943      OT LONG TERM GOAL #1   Title Pt will demonstrate understanding of updated HEP.    Time 12    Period Weeks    Status Achieved      OT LONG TERM GOAL #2   Title Pt will resume use of RUE as a dominant hand at least 90% of the time with pain less than or equal to 3/10    Time 12    Period Weeks    Status On-going   uses RUE 5-10% of the time     OT LONG TERM GOAL #3   Title Pt will demonstrate  grip strength of at least 30 lbs for RUE functional use.    Baseline 06/27/19 22.4 lbs    Time 12    Period Weeks    Status On-going   grip 22.4 lbs     OT LONG TERM GOAL #4   Title Pt will demonstrate at least 90% composite finger flexion for RUE.    Time 12    Period Weeks    Status On-going   grossly 75-80%     OT LONG TERM GOAL #5   Title Pt will perfrom simulated work activities modified independently    Time 12    Period Weeks    Status On-going                 Plan - 07/30/19 9211    Clinical Impression Statement Pt progressing towards goals. 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    Occupational performance deficits (Please refer to evaluation for details): ADL's;IADL's;Rest and Sleep;Work;Play;Leisure;Social Participation    Body Structure / Function / Physical Skills ADL;Edema;Skin integrity;Strength;UE  functional use;Flexibility;FMC;Pain;Proprioception;Coordination;Decreased knowledge of precautions;GMC;ROM;Wound;Scar mobility;Dexterity;IADL;Sensation    Rehab Potential Good    OT Frequency 2x / week    OT Duration 12 weeks    OT  Treatment/Interventions Self-care/ADL training;Therapeutic exercise;Splinting;Manual Therapy;Neuromuscular education;Ultrasound;Therapeutic activities;DME and/or AE instruction;Paraffin;Cryotherapy;Electrical Stimulation;Fluidtherapy;Scar mobilization;Patient/family education;Passive range of motion;Contrast Bath;Moist Heat    Plan continue aggressive ROM, strengthening, fluidotherapy, estim    Consulted and Agree with Plan of Care Patient           Patient will benefit from skilled therapeutic intervention in order to improve the following deficits and impairments:   Body Structure / Function / Physical Skills: ADL, Edema, Skin integrity, Strength, UE functional use, Flexibility, FMC, Pain, Proprioception, Coordination, Decreased knowledge of precautions, GMC, ROM, Wound, Scar mobility, Dexterity, IADL, Sensation       Visit Diagnosis: Stiffness of right hand, not elsewhere classified  Muscle weakness (generalized)  Pain in right hand  Localized edema  Other lack of coordination    Problem List Patient Active Problem List   Diagnosis Date Noted  . Soft tissue swelling   . Dental infection 05/31/2016    Mata Rowen 07/30/2019, 10:14 AM Keene Breath, OTR/L [phone] Phone: [phone] 10:16 AM 07/30/19 [facility] [address] [place], [place], [address] Phone: [phone]   Fax:  [phone]  Name: MALACHAI SCHALK MRN: [id number] Date of Birth: 1990-03-22

## 2019-08-01 ENCOUNTER — Ambulatory Visit: Payer: Worker's Compensation | Admitting: Occupational Therapy

## 2019-08-01 ENCOUNTER — Other Ambulatory Visit: Payer: Self-pay

## 2019-08-01 ENCOUNTER — Encounter: Payer: Self-pay | Admitting: Occupational Therapy

## 2019-08-01 DIAGNOSIS — R278 Other lack of coordination: Secondary | ICD-10-CM

## 2019-08-01 DIAGNOSIS — R600 Localized edema: Secondary | ICD-10-CM

## 2019-08-01 DIAGNOSIS — M6281 Muscle weakness (generalized): Secondary | ICD-10-CM

## 2019-08-01 DIAGNOSIS — M79641 Pain in right hand: Secondary | ICD-10-CM

## 2019-08-01 DIAGNOSIS — M25641 Stiffness of right hand, not elsewhere classified: Secondary | ICD-10-CM | POA: Diagnosis not present

## 2019-08-01 NOTE — Therapy (Signed)
[facility] [address] [place], [place], [address] Phone: [phone]   Fax:  [phone]  Occupational Therapy Treatment  Patient Details  Name: Marc Hebert MRN: [id number] Date of Birth: [birth year] Referring Provider (OT): Dr. Arita Miss   Encounter Date: 08/01/2019   OT End of Session - 08/01/19 1006    Visit Number 18    Number of Visits 25    Date for OT Re-Evaluation 07/29/19    Authorization Type worker's comp    Authorization Time Period additional 12 visits approved    Authorization - Visit Number 18    Authorization - Number of Visits 24    OT Start Time 0933    OT Stop Time 1015    OT Time Calculation (min) 42 min    Activity Tolerance Patient tolerated treatment well    Behavior During Therapy [facility] for tasks assessed/performed           Past Medical History:  Diagnosis Date  . Hypertension     Past Surgical History:  Procedure Laterality Date  . I & D EXTREMITY Right 04/10/2019   Procedure: IRRIGATION AND DEBRIDEMENT OF RIGHT LONG AND RING FINGERS .;  Surgeon: Allena Napoleon, MD;  Location: [facility] OR;  Service: Plastics;  Laterality: Right;  . PERCUTANEOUS PINNING Right 04/10/2019   Procedure: PERCUTANEOUS PINNING RIGHT LONG AND RING FINGERS.;  Surgeon: Allena Napoleon, MD;  Location: [facility] OR;  Service: Plastics;  Laterality: Right;    There were no vitals filed for this visit.   Subjective Assessment - 08/01/19 0957    Subjective  Pt reports increased pain in tip of middle finger    Pertinent History 29 y.o. male s/p  severe crush injury that occurred at work.  A metal beam fell on his right ring and long fingers.  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  The ring finger crush was through the level of the middle phalanx and was a near amputation.  Pt underwent I and D right and long fingers, Open reduction internal fixation of right ring finger middle phalanx fracture, Repair of  right ring finger FDP using a slip of the FDS as a tendon graft,  Repair of right ring finger extensor tendon, Reconstruction of right ring finger radial digital nerve with nerve tube, Revision amputation of right long finger    Limitations 06/26/19- pt cleared for more agressive therapy with no limits per Dr Arita Miss    Special Tests date of surgery 04/10/19,    Pt sees MD August 4th    Patient Stated Goals regain use of RUE    Currently in Pain? Yes    Pain Score 6     Pain Location Finger (Comment which one)   middle finger   Pain Orientation Right    Pain Descriptors / Indicators Aching    Pain Type Surgical pain    Pain Onset More than a month ago    Pain Frequency Intermittent    Aggravating Factors  use    Pain Relieving Factors rest                 Treatment:  OT Treatments/Exercises (OP) - 07/30/19 0001      Hand Exercises   Other Hand Exercises Composite P/ROM, Place and hold, and A/ROM in PIP blocking, reverse blocking, and full composite flexion. P/ROM in ring finger PIP flexion/ extension   No active flexion at DIP   Other Hand Exercises  Graded clothespins for sustained  pinch with ring and small finger for sustained pinch -yellow and red only  Gripper set at level 3 resistance to pick up blocks Rt hand for sustained grip strength, min difficulty     Modalities   Modalities Fluidotherapy x 10 mins no adverse reactions  Electrical Stimulation      Electrical Stimulation   Electrical Stimulation Location Volar forearm    Electrical Stimulation Action finger flexion    Electrical Stimulation Parameters 50  Pps, 250 pw , 10 secs cycle intensity 8.5   Electrical Stimulation Goals --   ROM     RUE Fluidotherapy   Number Minutes Fluidotherapy 10 Minutes    RUE Fluidotherapy Location Hand    Comments for stiffness and pain relief                                 OT Short Term Goals - 06/27/19 1139      OT SHORT TERM GOAL #1   Title I with  inital  HEP(once cleared by MD for A/ROM.    Time 6    Period Weeks    Status Achieved    Target Date 06/14/19      OT SHORT TERM GOAL #2   Title I with splint wear, care and precautions    Time 6    Period Weeks    Status Achieved      OT SHORT TERM GOAL #3   Title I with dressing changes/ hand hygiene    Time 6    Period Weeks    Status Achieved      OT SHORT TERM GOAL #4   Title Pt will demonstrate at least 50% composite finger flexion for RUE.    Time 6    Period Weeks    Status Achieved   RUE ring finger PIP 55, MP 85, no A/ROM at DIP            OT Long Term Goals - 08/01/19 1010      OT LONG TERM GOAL #1   Title Pt will demonstrate understanding of updated HEP.    Time 12    Period Weeks    Status Achieved      OT LONG TERM GOAL #2   Title Pt will resume use of RUE as a dominant hand at least 90% of the time with pain less than or equal to 3/10    Time 12    Period Weeks    Status On-going   uses RUE 5-10% of the time     OT LONG TERM GOAL #3   Title Pt will demonstrate  grip strength of at least 30 lbs for RUE functional use.    Baseline 06/27/19 22.4 lbs    Time 12    Period Weeks    Status On-going   grip 22.4 lbs     OT LONG TERM GOAL #4   Title Pt will demonstrate at least 90% composite finger flexion for RUE.    Time 12    Period Weeks    Status On-going   grossly 75-80%     OT LONG TERM GOAL #5   Title Pt will perfrom simulated work activities modified independently    Time 12    Period Weeks    Status On-going   Pt is currently perfroming light activities at work but he is unable to use [employer] for heavy  lifting now due to decreased strength and pain                Plan - 08/01/19 1004    Clinical Impression Statement Pt progressing towards goals. Pt's progress remins limited by pain in middle fingertip. Pt has new dark area at fingertip that looks like a blood blister. Pt instructed to mointor and to contact MD if he develops s/s of  infection.    Occupational performance deficits (Please refer to evaluation for details): ADL's;IADL's;Rest and Sleep;Work;Play;Leisure;Social Participation    Body Structure / Function / Physical Skills ADL;Edema;Skin integrity;Strength;UE functional use;Flexibility;FMC;Pain;Proprioception;Coordination;Decreased knowledge of precautions;GMC;ROM;Wound;Scar mobility;Dexterity;IADL;Sensation    Rehab Potential Good    OT Frequency 2x / week    OT Duration 12 weeks    OT Treatment/Interventions Self-care/ADL training;Therapeutic exercise;Splinting;Manual Therapy;Neuromuscular education;Ultrasound;Therapeutic activities;DME and/or AE instruction;Paraffin;Cryotherapy;Electrical Stimulation;Fluidtherapy;Scar mobilization;Patient/family education;Passive range of motion;Contrast Bath;Moist Heat    Plan continue aggressive ROM, strengthening, fluidotherapy, estim    Consulted and Agree with Plan of Care Patient           Patient will benefit from skilled therapeutic intervention in order to improve the following deficits and impairments:   Body Structure / Function / Physical Skills: ADL, Edema, Skin integrity, Strength, UE functional use, Flexibility, FMC, Pain, Proprioception, Coordination, Decreased knowledge of precautions, GMC, ROM, Wound, Scar mobility, Dexterity, IADL, Sensation       Visit Diagnosis: Stiffness of right hand, not elsewhere classified  Muscle weakness (generalized)  Pain in right hand  Localized edema  Other lack of coordination    Problem List Patient Active Problem List   Diagnosis Date Noted  . Soft tissue swelling   . Dental infection 05/31/2016    Aracely Rickett 08/01/2019, 10:11 AM Keene Breath, OTR/L [phone] Phone: [phone] 10:12 AM 08/01/19 [facility] [address] [place], [place], [address] Phone: [phone]   Fax:  [phone]  Name: Marc Hebert MRN:  [id number] Date of Birth: May 02, 1990

## 2019-08-05 ENCOUNTER — Other Ambulatory Visit: Payer: Self-pay

## 2019-08-05 ENCOUNTER — Ambulatory Visit: Payer: Worker's Compensation | Attending: Plastic Surgery | Admitting: Occupational Therapy

## 2019-08-05 DIAGNOSIS — M25641 Stiffness of right hand, not elsewhere classified: Secondary | ICD-10-CM

## 2019-08-05 DIAGNOSIS — R600 Localized edema: Secondary | ICD-10-CM | POA: Diagnosis present

## 2019-08-05 DIAGNOSIS — M6281 Muscle weakness (generalized): Secondary | ICD-10-CM | POA: Diagnosis present

## 2019-08-05 DIAGNOSIS — M79641 Pain in right hand: Secondary | ICD-10-CM | POA: Insufficient documentation

## 2019-08-05 NOTE — Therapy (Signed)
[facility] [address] [place], [place], [address] Phone: [phone]   Fax:  [phone]  Occupational Therapy Treatment  Patient Details  Name: Marc Hebert MRN: [id number] Date of Birth: 05/02/1990 Referring Provider (OT): Dr. Arita Miss   Encounter Date: 08/05/2019   OT End of Session - 08/05/19 0937    Visit Number 19    Number of Visits 25    Date for OT Re-Evaluation 07/29/19    Authorization Type worker's comp    Authorization Time Period additional 12 visits approved    Authorization - Visit Number 19    Authorization - Number of Visits 24    OT Start Time 0933    OT Stop Time 1013    OT Time Calculation (min) 40 min    Activity Tolerance Patient tolerated treatment well    Behavior During Therapy [facility] for tasks assessed/performed           Past Medical History:  Diagnosis Date  . Hypertension     Past Surgical History:  Procedure Laterality Date  . I & D EXTREMITY Right 04/10/2019   Procedure: IRRIGATION AND DEBRIDEMENT OF RIGHT LONG AND RING FINGERS .;  Surgeon: Allena Napoleon, MD;  Location: [facility] OR;  Service: Plastics;  Laterality: Right;  . PERCUTANEOUS PINNING Right 04/10/2019   Procedure: PERCUTANEOUS PINNING RIGHT LONG AND RING FINGERS.;  Surgeon: Allena Napoleon, MD;  Location: [facility] OR;  Service: Plastics;  Laterality: Right;    There were no vitals filed for this visit.   Subjective Assessment - 08/05/19 0936    Subjective  Pt reports continued pain in tip of middle finger    Pertinent History 29 y.o. male s/p  severe crush injury that occurred at work.  A metal beam fell on his right ring and long fingers.  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  The ring finger crush was through the level of the middle phalanx and was a near amputation.  Pt underwent I and D right and long fingers, Open reduction internal fixation of right ring finger middle phalanx fracture, Repair of  right ring finger FDP using a slip of the FDS as a tendon graft,  Repair of right ring finger extensor tendon, Reconstruction of right ring finger radial digital nerve with nerve tube, Revision amputation of right long finger    Limitations 06/26/19- pt cleared for more agressive therapy with no limits per Dr Arita Miss    Special Tests date of surgery 04/10/19,    Pt sees MD August 4th    Patient Stated Goals regain use of RUE    Currently in Pain? Yes    Pain Score 5     Pain Location Finger (Comment which one)    Pain Orientation Right    Pain Descriptors / Indicators Aching    Pain Type Chronic pain    Pain Onset More than a month ago    Pain Frequency Intermittent    Aggravating Factors  use    Pain Relieving Factors heat    Multiple Pain Sites No                      OT Treatments/Exercises (OP) -      Hand Exercises   Other Hand Exercises Composite P/ROM, Place and hold, and A/ROM in PIP blocking, reverse blocking, and full composite flexion. 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    Time 6    Period Weeks    Status Achieved    Target Date 06/14/19      OT SHORT TERM GOAL #2   Title I with splint wear, care and precautions    Time 6    Period Weeks    Status Achieved      OT SHORT TERM GOAL #3   Title I with dressing changes/ hand hygiene    Time 6    Period Weeks    Status Achieved      OT SHORT TERM GOAL #4   Title Pt will demonstrate at least 50% composite finger flexion for RUE.    Time 6    Period Weeks    Status Achieved   RUE ring finger PIP 55, MP 85, no A/ROM at DIP            OT Long Term Goals - 08/01/19 1010      OT LONG TERM GOAL #1   Title Pt will demonstrate understanding of updated HEP.    Time 12    Period Weeks    Status Achieved      OT LONG TERM GOAL #2   Title Pt will resume use of RUE as a dominant hand at least 90% of the time with pain less than or equal to 3/10    Time 12    Period Weeks    Status On-going   uses RUE 5-10% of the time     OT LONG TERM GOAL #3   Title Pt will demonstrate  grip strength of at least 30 lbs for RUE functional use.    Baseline 06/27/19 22.4 lbs    Time 12    Period Weeks    Status On-going   grip 22.4 lbs     OT LONG TERM GOAL #4   Title Pt will demonstrate at least 90% composite finger flexion for RUE.    Time 12    Period Weeks    Status On-going   grossly 75-80%     OT LONG TERM GOAL #5   Title Pt will perfrom simulated work activities modified independently    Time 12    Period Weeks    Status On-going   Pt is currently perfroming light activities at work but he is unable to use  RUe for heavy lifting now due to decreased strength and pain                Plan - 08/05/19 0937    Clinical Impression Statement Pt progressing towards goals with slowly improving ROM and strength. Pt has dark area at fingertip that looks like a scab. Pt instructed to mointor and to contact MD if he develops s/s of infection.    Occupational  performance deficits (Please refer to evaluation for details): ADL's;IADL's;Rest and Sleep;Work;Play;Leisure;Social Participation    Body Structure / Function / Physical Skills ADL;Edema;Skin integrity;Strength;UE functional use;Flexibility;FMC;Pain;Proprioception;Coordination;Decreased knowledge of precautions;GMC;ROM;Wound;Scar mobility;Dexterity;IADL;Sensation    Rehab Potential Good    OT Frequency 2x / week    OT Duration 12 weeks    OT Treatment/Interventions Self-care/ADL training;Therapeutic exercise;Splinting;Manual Therapy;Neuromuscular education;Ultrasound;Therapeutic activities;DME and/or AE instruction;Paraffin;Cryotherapy;Electrical Stimulation;Fluidtherapy;Scar mobilization;Patient/family education;Passive range of motion;Contrast Bath;Moist Heat    Plan continue aggressive ROM, strengthening, fluidotherapy, estim    Consulted and Agree with Plan of Care Patient           Patient will benefit from skilled therapeutic intervention in order to improve the following deficits and impairments:   Body Structure / Function / Physical Skills: ADL, Edema, Skin integrity, Strength, UE functional use, Flexibility, FMC, Pain, Proprioception, Coordination, Decreased knowledge of precautions, GMC, ROM, Wound, Scar mobility, Dexterity, IADL, Sensation       Visit Diagnosis: Stiffness of right hand, not elsewhere classified  Muscle weakness (generalized)  Pain in right hand  Localized edema    Problem List Patient Active Problem List   Diagnosis Date Noted  . Soft tissue swelling   . Dental infection 05/31/2016    Albi Rappaport 08/05/2019, 9:39 AM  [facility] [address] [place], [place], [address] Phone: [phone]   Fax:  [phone]  Name: JAYAN RAYMUNDO MRN: [id number] Date of Birth: 16-Feb-1990

## 2019-08-07 ENCOUNTER — Ambulatory Visit: Payer: Worker's Compensation | Admitting: Occupational Therapy

## 2019-08-12 ENCOUNTER — Ambulatory Visit: Payer: Worker's Compensation | Admitting: Occupational Therapy

## 2019-08-14 ENCOUNTER — Ambulatory Visit: Payer: Worker's Compensation | Admitting: Occupational Therapy

## 2019-08-19 ENCOUNTER — Ambulatory Visit: Payer: Worker's Compensation | Admitting: Occupational Therapy

## 2019-08-25 ENCOUNTER — Ambulatory Visit: Payer: Worker's Compensation | Attending: Plastic Surgery | Admitting: Occupational Therapy

## 2019-08-25 ENCOUNTER — Other Ambulatory Visit: Payer: Self-pay

## 2019-08-25 DIAGNOSIS — M25641 Stiffness of right hand, not elsewhere classified: Secondary | ICD-10-CM | POA: Diagnosis not present

## 2019-08-25 DIAGNOSIS — M6281 Muscle weakness (generalized): Secondary | ICD-10-CM | POA: Diagnosis present

## 2019-08-25 DIAGNOSIS — M79641 Pain in right hand: Secondary | ICD-10-CM

## 2019-08-25 NOTE — Therapy (Signed)
[facility] [address] [place], [place], [address] Phone: [phone]   Fax:  [phone]  Occupational Therapy Treatment  Patient Details  Name: Marc Hebert MRN: [id number] Date of Birth: 1990/05/01 Referring Provider (OT): Dr. Arita Miss   Encounter Date: 08/25/2019   OT End of Session - 08/25/19 1028    Visit Number 20    Number of Visits 25    Date for OT Re-Evaluation 07/29/19    Authorization Type worker's comp    Authorization Time Period additional 12 visits approved    Authorization - Visit Number 20    Authorization - Number of Visits 24    OT Start Time 1018    OT Stop Time 1100    OT Time Calculation (min) 42 min    Activity Tolerance Patient tolerated treatment well    Behavior During Therapy WFL for tasks assessed/performed           Past Medical History:  Diagnosis Date  . Hypertension     Past Surgical History:  Procedure Laterality Date  . I & D EXTREMITY Right 04/10/2019   Procedure: IRRIGATION AND DEBRIDEMENT OF RIGHT LONG AND RING FINGERS .;  Surgeon: Allena Napoleon, MD;  Location: [facility] OR;  Service: Plastics;  Laterality: Right;  . PERCUTANEOUS PINNING Right 04/10/2019   Procedure: PERCUTANEOUS PINNING RIGHT LONG AND RING FINGERS.;  Surgeon: Allena Napoleon, MD;  Location: [facility] OR;  Service: Plastics;  Laterality: Right;    There were no vitals filed for this visit.   Subjective Assessment - 08/25/19 1025    Subjective  I see the doctor on Wednesday   Pertinent History 29 y.o. male s/p  severe crush injury that occurred at work.  A metal beam fell on his right ring and long fingers.  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  The ring finger crush was through the level of the middle phalanx and was a near amputation.  Pt underwent I and D right and long fingers, Open reduction internal fixation of right ring finger middle phalanx fracture, Repair of right ring finger FDP  using a slip of the FDS as a tendon graft,  Repair of right ring finger extensor tendon, Reconstruction of right ring finger radial digital nerve with nerve tube, Revision amputation of right long finger    Limitations 06/26/19- pt cleared for more agressive therapy with no limits per Dr Arita Miss    Special Tests date of surgery 04/10/19,    Pt sees MD August 4th    Patient Stated Goals regain use of RUE    Currently in Pain? Yes    Pain Score 6     Pain Location Finger (Comment which one)   LONG FINGER   Pain Orientation Right    Pain Descriptors / Indicators Aching    Pain Type Chronic pain    Pain Onset More than a month ago    Pain Frequency Intermittent    Aggravating Factors  USE    Pain Relieving Factors HEAT                        OT Treatments/Exercises (OP) - 08/25/19 0001      Hand Exercises   Other Hand Exercises P/ROM, Place and hold, and A/ROM in blocking, reverse blocking, and full composite flexion. P/ROM in ring finger extension      Electrical Stimulation   Electrical Stimulation Location Volar forearm  Electrical Stimulation Action finger flexion    Electrical Stimulation Parameters 50 pps, 250 pw, 10 sec on/off cycle x 10 min total    Electrical Stimulation Goals --   ROM     RUE Fluidotherapy   Number Minutes Fluidotherapy 10 Minutes    RUE Fluidotherapy Location Hand    Comments to decrease stiffness      Splinting   Splinting Pt issued finger cap pre fab splint for Rt long finger tip (due to pain and for protection) - therapist had to trim down to fit finger. Pt pleased with this and reports it will help to wear at work          PIP flex = 65* (isolated), 73* w/ MP flexion.           OT Short Term Goals - 06/27/19 1139      OT SHORT TERM GOAL #1   Title I with inital  HEP(once cleared by MD for A/ROM.    Time 6    Period Weeks    Status Achieved    Target Date 06/14/19      OT SHORT TERM GOAL #2   Title I with splint wear, care  and precautions    Time 6    Period Weeks    Status Achieved      OT SHORT TERM GOAL #3   Title I with dressing changes/ hand hygiene    Time 6    Period Weeks    Status Achieved      OT SHORT TERM GOAL #4   Title Pt will demonstrate at least 50% composite finger flexion for RUE.    Time 6    Period Weeks    Status Achieved   RUE ring finger PIP 55, MP 85, no A/ROM at DIP            OT Long Term Goals - 08/01/19 1010      OT LONG TERM GOAL #1   Title Pt will demonstrate understanding of updated HEP.    Time 12    Period Weeks    Status Achieved      OT LONG TERM GOAL #2   Title Pt will resume use of RUE as a dominant hand at least 90% of the time with pain less than or equal to 3/10    Time 12    Period Weeks    Status On-going   uses RUE 5-10% of the time     OT LONG TERM GOAL #3   Title Pt will demonstrate  grip strength of at least 30 lbs for RUE functional use.    Baseline 06/27/19 22.4 lbs    Time 12    Period Weeks    Status On-going   grip 22.4 lbs     OT LONG TERM GOAL #4   Title Pt will demonstrate at least 90% composite finger flexion for RUE.    Time 12    Period Weeks    Status On-going   grossly 75-80%     OT LONG TERM GOAL #5   Title Pt will perfrom simulated work activities modified independently    Time 12    Period Weeks    Status On-going   Pt is currently perfroming light activities at work but he is unable to use RUe for heavy lifting now due to decreased strength and pain                Plan - 08/25/19 1059  Clinical Impression Statement Pt progressing with Ring finger PIP joint. Pt sees MD on Wednesday and has 4 more approved visits w/ O.T.    Occupational performance deficits (Please refer to evaluation for details): ADL's;IADL's;Rest and Sleep;Work;Play;Leisure;Social Participation    Body Structure / Function / Physical Skills ADL;Edema;Skin integrity;Strength;UE functional  use;Flexibility;FMC;Pain;Proprioception;Coordination;Decreased knowledge of precautions;GMC;ROM;Wound;Scar mobility;Dexterity;IADL;Sensation    Rehab Potential Good    OT Frequency 2x / week    OT Duration 12 weeks    OT Treatment/Interventions Self-care/ADL training;Therapeutic exercise;Splinting;Manual Therapy;Neuromuscular education;Ultrasound;Therapeutic activities;DME and/or AE instruction;Paraffin;Cryotherapy;Electrical Stimulation;Fluidtherapy;Scar mobilization;Patient/family education;Passive range of motion;Contrast Bath;Moist Heat    Plan continue aggressive ROM, strengthening, fluidotherapy, estim, discuss anticipated d/c after 24 visits used (following MD appt/discussion)    Consulted and Agree with Plan of Care Patient           Patient will benefit from skilled therapeutic intervention in order to improve the following deficits and impairments:   Body Structure / Function / Physical Skills: ADL, Edema, Skin integrity, Strength, UE functional use, Flexibility, FMC, Pain, Proprioception, Coordination, Decreased knowledge of precautions, GMC, ROM, Wound, Scar mobility, Dexterity, IADL, Sensation       Visit Diagnosis: Stiffness of right hand, not elsewhere classified  Muscle weakness (generalized)  Pain in right hand    Problem List Patient Active Problem List   Diagnosis Date Noted  . Soft tissue swelling   . Dental infection 05/31/2016    Kelli Churn, OTR/L 08/25/2019, 11:56 AM  [facility] [address] [place], [place], [address] Phone: [phone]   Fax:  [phone]  Name: LAMON ROTUNDO MRN: [id number] Date of Birth: 1990-11-17

## 2019-08-27 ENCOUNTER — Encounter: Payer: Self-pay | Admitting: Plastic Surgery

## 2019-08-27 ENCOUNTER — Other Ambulatory Visit: Payer: Self-pay

## 2019-08-27 ENCOUNTER — Ambulatory Visit (INDEPENDENT_AMBULATORY_CARE_PROVIDER_SITE_OTHER): Payer: Worker's Compensation | Admitting: Plastic Surgery

## 2019-08-27 VITALS — BP 148/87 | HR 89 | Temp 97.5°F

## 2019-08-27 DIAGNOSIS — S62622B Displaced fracture of medial phalanx of right middle finger, initial encounter for open fracture: Secondary | ICD-10-CM

## 2019-08-27 NOTE — Progress Notes (Signed)
   Referring Provider No referring provider defined for this encounter.   CC:  Chief Complaint  Patient presents with  . Follow-up      Marc Hebert is an 29 y.o. male.  HPI: Patient presents about 4-1/2 months postop from crush injury to his right hand.  This resulted in amputation of the long finger and reconstruction of the ring finger with pins, tendon graft, extensor tendon repair, and bridging of the digital nerve with a nerve tube.  He feels like at this point he is doing reasonably well from the standpoint of his ring finger but has quite a bit of pain with regards to the long finger amputation stump.  I did leave him a portion of the middle phalanx distal to the PIP joint he has quite a bit of pain when he moves this.    Review of Systems General: Denies fevers or chills  Physical Exam Vitals with BMI 08/27/2019 06/26/2019 05/29/2019  Height - 6' 4" 6' 4"  Weight - 287 lbs 287 lbs  BMI - 34.95 34.95  Systolic 148 166 176  Diastolic 87 99 100  Pulse 89 89 93    General:  No acute distress,  Alert and oriented, Non-Toxic, Normal speech and affect Right hand: Fingers well-perfused normal capillary refill and palp radial pulse.  Sensation is intact although diminished in the distal aspect of the ring finger.  There are not really any areas of hypersensitivity in the ring finger but on the long finger amputation stump there is a spicule of what could be bone poking through the incision.  He is also sensitive throughout.  He is able to flex and extend at the PIP joint but this causes him quite a bit of discomfort.  There is probably about a centimeter of middle phalanx left.  His ring finger surprisingly has nearly full extension and he has about 30 degrees of flexion at the PIP joint.  He has good range of motion of the MP joint.  He can nearly get his ring fingertip down to his palm.  Assessment/Plan Patient's main concern at this point is pain regarding the long finger amputation  stump.  We discussed revision amputation.  He is actually in favor of making the finger shorter and taking the remainder of the middle phalanx out so he can no longer flex at that point.  I think this would help with sensitivity for him.  I can also dissect out the digital nerves and trim them back a bit.  For the time being he is content with leaving the ring finger alone as he feels like it is reasonably functional as it is.  I explained given the extent of his initial injury I thought he had a fairly acceptable outcome regarding the ring finger but we could always address it later if it does become more of an issue for him.  We will plan to set this up for him in the operating room.  We discussed the risk of the procedure that include bleeding, infection, damage to surrounding structures, need for additional procedures.  We discussed the potential for persistent pain and the fact that the finger would be shorter after surgery.  Avaneesh Pepitone S Yanissa Michalsky 08/27/2019, 9:11 AM        

## 2019-08-27 NOTE — H&P (View-Only) (Signed)
   Referring Provider No referring provider defined for this encounter.   CC:  Chief Complaint  Patient presents with  . Follow-up      Marc Hebert is an 29 y.o. male.  HPI: Patient presents about 4-1/2 months postop from crush injury to his right hand.  This resulted in amputation of the long finger and reconstruction of the ring finger with pins, tendon graft, extensor tendon repair, and bridging of the digital nerve with a nerve tube.  He feels like at this point he is doing reasonably well from the standpoint of his ring finger but has quite a bit of pain with regards to the long finger amputation stump.  I did leave him a portion of the middle phalanx distal to the PIP joint he has quite a bit of pain when he moves this.    Review of Systems General: Denies fevers or chills  Physical Exam Vitals with BMI 08/27/2019 06/26/2019 05/29/2019  Height - 6\' 4"  6\' 4"   Weight - 287 lbs 287 lbs  BMI - 34.95 34.95  Systolic 148 166  Diastolic 87 99 100  Pulse 89 89 93    General:  No acute distress,  Alert and oriented, Non-Toxic, Normal speech and affect Right hand: Fingers well-perfused normal capillary refill and palp radial pulse.  Sensation is intact although diminished in the distal aspect of the ring finger.  There are not really any areas of hypersensitivity in the ring finger but on the long finger amputation stump there is a spicule of what could be bone poking through the incision.  He is also sensitive throughout.  He is able to flex and extend at the PIP joint but this causes him quite a bit of discomfort.  There is probably about a centimeter of middle phalanx left.  His ring finger surprisingly has nearly full extension and he has about 30 degrees of flexion at the PIP joint.  He has good range of motion of the MP joint.  He can nearly get his ring fingertip down to his palm.  Assessment/Plan Patient's main concern at this point is pain regarding the long finger amputation  stump.  We discussed revision amputation.  He is actually in favor of making the finger shorter and taking the remainder of the middle phalanx out so he can no longer flex at that point.  I think this would help with sensitivity for him.  I can also dissect out the digital nerves and trim them back a bit.  For the time being he is content with leaving the ring finger alone as he feels like it is reasonably functional as it is.  I explained given the extent of his initial injury I thought he had a fairly acceptable outcome regarding the ring finger but we could always address it later if it does become more of an issue for him.  We will plan to set this up for him in the operating room.  We discussed the risk of the procedure that include bleeding, infection, damage to surrounding structures, need for additional procedures.  We discussed the potential for persistent pain and the fact that the finger would be shorter after surgery.  08/27/2019, 9:11 AM

## 2019-09-02 ENCOUNTER — Other Ambulatory Visit: Payer: Self-pay

## 2019-09-02 ENCOUNTER — Encounter (HOSPITAL_BASED_OUTPATIENT_CLINIC_OR_DEPARTMENT_OTHER): Payer: Self-pay | Admitting: Plastic Surgery

## 2019-09-05 ENCOUNTER — Ambulatory Visit: Payer: Self-pay | Admitting: Occupational Therapy

## 2019-09-06 ENCOUNTER — Other Ambulatory Visit (HOSPITAL_COMMUNITY)
Admission: RE | Admit: 2019-09-06 | Discharge: 2019-09-06 | Disposition: A | Discharge status: Home / Self Care | Payer: HRSA Program | Source: Ambulatory Visit | Attending: Plastic Surgery | Admitting: Plastic Surgery

## 2019-09-06 DIAGNOSIS — Z20822 Contact with and (suspected) exposure to covid-19: Secondary | ICD-10-CM | POA: Insufficient documentation

## 2019-09-06 DIAGNOSIS — Z01812 Encounter for preprocedural laboratory examination: Secondary | ICD-10-CM | POA: Insufficient documentation

## 2019-09-06 LAB — SARS CORONAVIRUS 2 (TAT 6-24 HRS): SARS Coronavirus 2: NEGATIVE

## 2019-09-08 ENCOUNTER — Encounter (HOSPITAL_BASED_OUTPATIENT_CLINIC_OR_DEPARTMENT_OTHER): Payer: Self-pay | Admitting: Plastic Surgery

## 2019-09-08 ENCOUNTER — Other Ambulatory Visit: Payer: Self-pay

## 2019-09-08 ENCOUNTER — Encounter (HOSPITAL_BASED_OUTPATIENT_CLINIC_OR_DEPARTMENT_OTHER)
Admission: RE | Disposition: A | Discharge status: Home / Self Care | Payer: Self-pay | Source: Home / Self Care | Attending: Plastic Surgery

## 2019-09-08 ENCOUNTER — Ambulatory Visit (HOSPITAL_BASED_OUTPATIENT_CLINIC_OR_DEPARTMENT_OTHER)
Admission: RE | Admit: 2019-09-08 | Discharge: 2019-09-08 | Disposition: A | Discharge status: Home / Self Care | Payer: Worker's Compensation | Attending: Plastic Surgery | Admitting: Plastic Surgery

## 2019-09-08 ENCOUNTER — Ambulatory Visit (HOSPITAL_BASED_OUTPATIENT_CLINIC_OR_DEPARTMENT_OTHER): Payer: Worker's Compensation | Admitting: Certified Registered"

## 2019-09-08 ENCOUNTER — Telehealth: Payer: Self-pay

## 2019-09-08 ENCOUNTER — Encounter: Payer: Self-pay | Admitting: Plastic Surgery

## 2019-09-08 DIAGNOSIS — W230XXA Caught, crushed, jammed, or pinched between moving objects, initial encounter: Secondary | ICD-10-CM | POA: Insufficient documentation

## 2019-09-08 DIAGNOSIS — I10 Essential (primary) hypertension: Secondary | ICD-10-CM | POA: Diagnosis not present

## 2019-09-08 DIAGNOSIS — Z6837 Body mass index (BMI) 37.0-37.9, adult: Secondary | ICD-10-CM | POA: Insufficient documentation

## 2019-09-08 DIAGNOSIS — T8789 Other complications of amputation stump: Secondary | ICD-10-CM | POA: Insufficient documentation

## 2019-09-08 DIAGNOSIS — E669 Obesity, unspecified: Secondary | ICD-10-CM | POA: Insufficient documentation

## 2019-09-08 DIAGNOSIS — F17200 Nicotine dependence, unspecified, uncomplicated: Secondary | ICD-10-CM | POA: Insufficient documentation

## 2019-09-08 DIAGNOSIS — S62622B Displaced fracture of medial phalanx of right middle finger, initial encounter for open fracture: Secondary | ICD-10-CM

## 2019-09-08 DIAGNOSIS — G8928 Other chronic postprocedural pain: Secondary | ICD-10-CM | POA: Insufficient documentation

## 2019-09-08 HISTORY — PX: AMPUTATION: SHX166

## 2019-09-08 SURGERY — AMPUTATION DIGIT
Anesthesia: General | Site: Finger | Laterality: Right

## 2019-09-08 MED ORDER — OXYCODONE HCL 5 MG PO TABS
ORAL_TABLET | ORAL | Status: AC
  Filled 2019-09-08: qty 1

## 2019-09-08 MED ORDER — MIDAZOLAM HCL 5 MG/5ML IJ SOLN
INTRAMUSCULAR | Status: DC | PRN
  Administered 2019-09-08: 2 mg via INTRAVENOUS

## 2019-09-08 MED ORDER — FENTANYL CITRATE (PF) 100 MCG/2ML IJ SOLN
25.0000 ug | INTRAMUSCULAR | Status: DC | PRN
  Administered 2019-09-08 (×3): 50 ug via INTRAVENOUS

## 2019-09-08 MED ORDER — FENTANYL CITRATE (PF) 100 MCG/2ML IJ SOLN
INTRAMUSCULAR | Status: AC
  Filled 2019-09-08: qty 2

## 2019-09-08 MED ORDER — PROPOFOL 10 MG/ML IV BOLUS
INTRAVENOUS | Status: DC | PRN
  Administered 2019-09-08: 280 mg via INTRAVENOUS

## 2019-09-08 MED ORDER — HYDROMORPHONE HCL 1 MG/ML IJ SOLN
INTRAMUSCULAR | Status: AC
  Filled 2019-09-08: qty 0.5

## 2019-09-08 MED ORDER — LACTATED RINGERS IV SOLN: INTRAVENOUS | Status: DC

## 2019-09-08 MED ORDER — OXYCODONE HCL 5 MG PO CAPS: 5.0000 mg | ORAL_CAPSULE | Freq: Four times a day (QID) | ORAL | 0 refills | Status: AC | PRN

## 2019-09-08 MED ORDER — CEFAZOLIN SODIUM-DEXTROSE 1-4 GM/50ML-% IV SOLN
INTRAVENOUS | Status: AC
  Filled 2019-09-08: qty 50

## 2019-09-08 MED ORDER — OXYCODONE HCL 5 MG PO TABS
5.0000 mg | ORAL_TABLET | Freq: Once | ORAL | Status: AC
  Administered 2019-09-08: 5 mg via ORAL

## 2019-09-08 MED ORDER — DEXAMETHASONE SODIUM PHOSPHATE 10 MG/ML IJ SOLN
INTRAMUSCULAR | Status: AC
  Filled 2019-09-08: qty 1

## 2019-09-08 MED ORDER — CEFAZOLIN SODIUM-DEXTROSE 2-4 GM/100ML-% IV SOLN
INTRAVENOUS | Status: AC
  Filled 2019-09-08: qty 100

## 2019-09-08 MED ORDER — ONDANSETRON HCL 4 MG/2ML IJ SOLN
INTRAMUSCULAR | Status: DC | PRN
  Administered 2019-09-08: 4 mg via INTRAVENOUS

## 2019-09-08 MED ORDER — MIDAZOLAM HCL 2 MG/2ML IJ SOLN
INTRAMUSCULAR | Status: AC
  Filled 2019-09-08: qty 2

## 2019-09-08 MED ORDER — BUPIVACAINE HCL (PF) 0.25 % IJ SOLN
INTRAMUSCULAR | Status: DC | PRN
  Administered 2019-09-08: 10 mL

## 2019-09-08 MED ORDER — FENTANYL CITRATE (PF) 100 MCG/2ML IJ SOLN
INTRAMUSCULAR | Status: DC | PRN
  Administered 2019-09-08 (×2): 25 ug via INTRAVENOUS

## 2019-09-08 MED ORDER — LIDOCAINE 2% (20 MG/ML) 5 ML SYRINGE
INTRAMUSCULAR | Status: AC
  Filled 2019-09-08: qty 5

## 2019-09-08 MED ORDER — DEXAMETHASONE SODIUM PHOSPHATE 10 MG/ML IJ SOLN
INTRAMUSCULAR | Status: DC | PRN
  Administered 2019-09-08: 5 mg via INTRAVENOUS

## 2019-09-08 MED ORDER — DEXTROSE 5 % IV SOLN
3.0000 g | INTRAVENOUS | Status: AC
  Administered 2019-09-08: 3 g via INTRAVENOUS

## 2019-09-08 MED ORDER — LIDOCAINE-EPINEPHRINE 1 %-1:100000 IJ SOLN
INTRAMUSCULAR | Status: AC
  Filled 2019-09-08: qty 1

## 2019-09-08 MED ORDER — HYDROCODONE-ACETAMINOPHEN 7.5-325 MG PO TABS: 1.0000 | ORAL_TABLET | Freq: Once | ORAL | Status: DC | PRN

## 2019-09-08 MED ORDER — LIDOCAINE-EPINEPHRINE 1 %-1:100000 IJ SOLN
INTRAMUSCULAR | Status: DC | PRN
  Administered 2019-09-08: 10 mL

## 2019-09-08 MED ORDER — HYDROCODONE-ACETAMINOPHEN 5-325 MG PO TABS: 1.0000 | ORAL_TABLET | Freq: Four times a day (QID) | ORAL | 0 refills | Status: AC | PRN

## 2019-09-08 MED ORDER — PROPOFOL 10 MG/ML IV BOLUS
INTRAVENOUS | Status: AC
  Filled 2019-09-08: qty 40

## 2019-09-08 MED ORDER — ONDANSETRON HCL 4 MG/2ML IJ SOLN
INTRAMUSCULAR | Status: AC
  Filled 2019-09-08: qty 2

## 2019-09-08 MED ORDER — LIDOCAINE 2% (20 MG/ML) 5 ML SYRINGE
INTRAMUSCULAR | Status: DC | PRN
  Administered 2019-09-08: 100 mg via INTRAVENOUS

## 2019-09-08 MED ORDER — ONDANSETRON HCL 4 MG/2ML IJ SOLN: 4.0000 mg | Freq: Once | INTRAMUSCULAR | Status: DC | PRN

## 2019-09-08 MED ORDER — HYDROMORPHONE HCL 1 MG/ML IJ SOLN
0.5000 mg | INTRAMUSCULAR | Status: DC | PRN
  Administered 2019-09-08 (×2): 0.5 mg via INTRAVENOUS

## 2019-09-08 SURGICAL SUPPLY — 75 items
APL PRP STRL LF DISP 70% ISPRP (MISCELLANEOUS) ×1
BAG DECANTER FOR FLEXI CONT (MISCELLANEOUS) IMPLANT
BLADE MINI RND TIP GREEN BEAV (BLADE) IMPLANT
BLADE SURG 15 STRL LF DISP TIS (BLADE) ×1 IMPLANT
BLADE SURG 15 STRL SS (BLADE) ×3
BNDG CMPR 9X4 STRL LF SNTH (GAUZE/BANDAGES/DRESSINGS) ×1
BNDG COHESIVE 3X5 TAN STRL LF (GAUZE/BANDAGES/DRESSINGS) ×3 IMPLANT
BNDG ELASTIC 3X5.8 VLCR STR LF (GAUZE/BANDAGES/DRESSINGS) ×2 IMPLANT
BNDG ESMARK 4X9 LF (GAUZE/BANDAGES/DRESSINGS) ×3 IMPLANT
BNDG GAUZE ELAST 4 BULKY (GAUZE/BANDAGES/DRESSINGS) ×3 IMPLANT
CHLORAPREP W/TINT 26 (MISCELLANEOUS) ×3 IMPLANT
CORD BIPOLAR FORCEPS 12FT (ELECTRODE) ×3 IMPLANT
COVER BACK TABLE 60X90IN (DRAPES) ×3 IMPLANT
COVER MAYO STAND STRL (DRAPES) ×3 IMPLANT
COVER SURGICAL LIGHT HANDLE (MISCELLANEOUS) ×2 IMPLANT
COVER WAND RF STERILE (DRAPES) IMPLANT
CUFF TOURN SGL QUICK 18X4 (TOURNIQUET CUFF) ×3 IMPLANT
CUFF TOURN SGL QUICK 24 (TOURNIQUET CUFF) ×3
CUFF TRNQT CYL 24X4X16.5-23 (TOURNIQUET CUFF) ×1 IMPLANT
DECANTER SPIKE VIAL GLASS SM (MISCELLANEOUS) IMPLANT
DRAIN TLS ROUND 10FR (DRAIN) IMPLANT
DRAPE EXTREMITY T 121X128X90 (DISPOSABLE) ×3 IMPLANT
DRAPE IMP U-DRAPE 54X76 (DRAPES) ×2 IMPLANT
DRAPE SURG 17X23 STRL (DRAPES) ×3 IMPLANT
DRSG PAD ABDOMINAL 8X10 ST (GAUZE/BANDAGES/DRESSINGS) IMPLANT
GAUZE 4X4 16PLY RFD (DISPOSABLE) IMPLANT
GAUZE SPONGE 4X4 12PLY STRL (GAUZE/BANDAGES/DRESSINGS) ×3 IMPLANT
GAUZE XEROFORM 1X8 LF (GAUZE/BANDAGES/DRESSINGS) ×3 IMPLANT
GLOVE BIOGEL M STRL SZ7.5 (GLOVE) ×3 IMPLANT
GLOVE BIOGEL PI IND STRL 8 (GLOVE) ×1 IMPLANT
GLOVE BIOGEL PI INDICATOR 8 (GLOVE) ×2
GOWN STRL REUS W/ TWL LRG LVL3 (GOWN DISPOSABLE) ×2 IMPLANT
GOWN STRL REUS W/TWL LRG LVL3 (GOWN DISPOSABLE) ×6
LOOP VESSEL MAXI BLUE (MISCELLANEOUS) IMPLANT
NDL HYPO 25X1 1.5 SAFETY (NEEDLE) IMPLANT
NDL KEITH (NEEDLE) IMPLANT
NDL PRECISIONGLIDE 27X1.5 (NEEDLE) IMPLANT
NEEDLE HYPO 25X1 1.5 SAFETY (NEEDLE) IMPLANT
NEEDLE KEITH (NEEDLE) IMPLANT
NEEDLE PRECISIONGLIDE 27X1.5 (NEEDLE) IMPLANT
NS IRRIG 1000ML POUR BTL (IV SOLUTION) ×3 IMPLANT
PACK BASIN DAY SURGERY FS (CUSTOM PROCEDURE TRAY) ×3 IMPLANT
PAD CAST 3X4 CTTN HI CHSV (CAST SUPPLIES) ×1 IMPLANT
PAD CAST 4YDX4 CTTN HI CHSV (CAST SUPPLIES) IMPLANT
PADDING CAST ABS 3INX4YD NS (CAST SUPPLIES)
PADDING CAST ABS 4INX4YD NS (CAST SUPPLIES) ×2
PADDING CAST ABS COTTON 3X4 (CAST SUPPLIES) IMPLANT
PADDING CAST ABS COTTON 4X4 ST (CAST SUPPLIES) ×1 IMPLANT
PADDING CAST COTTON 3X4 STRL (CAST SUPPLIES) ×3
PADDING CAST COTTON 4X4 STRL (CAST SUPPLIES)
SLEEVE SCD COMPRESS KNEE MED (MISCELLANEOUS) IMPLANT
SPLINT PLASTER CAST XFAST 3X15 (CAST SUPPLIES) IMPLANT
SPLINT PLASTER XTRA FASTSET 3X (CAST SUPPLIES)
STOCKINETTE 4X48 STRL (DRAPES) ×3 IMPLANT
SUT CHROMIC 4 0 PS 2 18 (SUTURE) ×3 IMPLANT
SUT CHROMIC 5 0 P 3 (SUTURE) IMPLANT
SUT ETHIBOND 3-0 V-5 (SUTURE) IMPLANT
SUT ETHILON 3 0 PS 1 (SUTURE) IMPLANT
SUT ETHILON 4 0 PS 2 18 (SUTURE) ×3 IMPLANT
SUT FIBERWIRE 4-0 18 DIAM BLUE (SUTURE)
SUT MERSILENE 2.0 SH NDLE (SUTURE) IMPLANT
SUT MERSILENE 4 0 P 3 (SUTURE) IMPLANT
SUT PROLENE 2 0 SH DA (SUTURE) IMPLANT
SUT PROLENE 5 0 P 3 (SUTURE) IMPLANT
SUT SILK 4 0 PS 2 (SUTURE) IMPLANT
SUT SUPRAMID 4-0 (SUTURE) IMPLANT
SUT VIC AB 4-0 P-3 18XBRD (SUTURE) IMPLANT
SUT VIC AB 4-0 P3 18 (SUTURE)
SUT VICRYL 4-0 PS2 18IN ABS (SUTURE) IMPLANT
SUTURE FIBERWR 4-0 18 DIA BLUE (SUTURE) IMPLANT
SYR BULB EAR ULCER 3OZ GRN STR (SYRINGE) ×3 IMPLANT
SYR CONTROL 10ML LL (SYRINGE) IMPLANT
TOWEL GREEN STERILE FF (TOWEL DISPOSABLE) ×3 IMPLANT
TUBE FEEDING ENTERAL 5FR 16IN (TUBING) IMPLANT
UNDERPAD 30X36 HEAVY ABSORB (UNDERPADS AND DIAPERS) ×3 IMPLANT

## 2019-09-08 NOTE — Interval H&P Note (Signed)
History and Physical Interval Note:  09/08/2019 2:57 PM  Willeen Niece  has presented today for surgery, with the diagnosis of Displaced Fracture Of Middle Phalanx Of Right Middle Finger.  The various methods of treatment have been discussed with the patient and family. After consideration of risks, benefits and other options for treatment, the patient has consented to  Procedure(s): Revision amputation right long finger (Right) as a surgical intervention.  The patient's history has been reviewed, patient examined, no change in status, stable for surgery.  I have reviewed the patient's chart and labs.  Questions were answered to the patient's satisfaction.     Marc Hebert

## 2019-09-08 NOTE — Anesthesia Procedure Notes (Signed)
Procedure Name: LMA Insertion Date/Time: 09/08/2019 3:11 PM Performed by: Lauralyn Primes, CRNA Pre-anesthesia Checklist: Patient identified, Emergency Drugs available, Suction available and Patient being monitored Patient Re-evaluated:Patient Re-evaluated prior to induction Oxygen Delivery Method: Circle system utilized Preoxygenation: Pre-oxygenation with 100% oxygen Induction Type: IV induction Ventilation: Mask ventilation without difficulty LMA: LMA inserted LMA Size: 5.0 Number of attempts: 1 Airway Equipment and Method: Bite block Placement Confirmation: positive ETCO2 Tube secured with: Tape Dental Injury: Teeth and Oropharynx as per pre-operative assessment

## 2019-09-08 NOTE — Anesthesia Postprocedure Evaluation (Signed)
Anesthesia Post Note  Patient: Marc Hebert  Procedure(s) Performed: Revision amputation right long finger (Right Finger)     Patient location during evaluation: PACU Anesthesia Type: General Level of consciousness: awake and alert and oriented Pain management: pain level controlled Vital Signs Assessment: post-procedure vital signs reviewed and stable Respiratory status: spontaneous breathing, nonlabored ventilation and respiratory function stable Cardiovascular status: blood pressure returned to baseline and stable Postop Assessment: no apparent nausea or vomiting Anesthetic complications: no   No complications documented.  Last Vitals:  Vitals:   09/08/19 1600 09/08/19 1605  BP: (!) 156/78   Pulse: 93 86  Resp: (!) 25 16  Temp:    SpO2: 98% 95%    Last Pain:  Vitals:   09/08/19 1605  TempSrc:   PainSc: 5                  Sarahgrace Broman A.

## 2019-09-08 NOTE — Anesthesia Preprocedure Evaluation (Addendum)
Anesthesia Evaluation  Patient identified by MRN, date of birth, ID band Patient awake    Reviewed: Allergy & Precautions, NPO status , Patient's Chart, lab work & pertinent test results  History of Anesthesia Complications Negative for: history of anesthetic complications  Airway Mallampati: II  TM Distance: >3 FB Neck ROM: Full    Dental no notable dental hx. (+) Dental Advisory Given   Pulmonary Current Smoker and Patient abstained from smoking., former smoker,  09/06/2019 SARS coronavirus NEG   breath sounds clear to auscultation       Cardiovascular hypertension,  Rhythm:Regular Rate:Normal     Neuro/Psych negative neurological ROS  negative psych ROS   GI/Hepatic negative GI ROS, Neg liver ROS,   Endo/Other  Obesity  Renal/GU negative Renal ROS  negative genitourinary   Musculoskeletal Traumatic amputation right long finger Crush injury 3/21 S/P ORIF   Abdominal (+) + obese,   Peds  Hematology negative hematology ROS (+)   Anesthesia Other Findings   Reproductive/Obstetrics                            Anesthesia Physical  Anesthesia Plan  ASA: II  Anesthesia Plan: General   Post-op Pain Management:    Induction: Intravenous  PONV Risk Score and Plan: 3 and Ondansetron, Midazolam and Treatment may vary due to age or medical condition  Airway Management Planned: LMA  Additional Equipment: None  Intra-op Plan:   Post-operative Plan: Extubation in OR  Informed Consent: I have reviewed the patients History and Physical, chart, labs and discussed the procedure including the risks, benefits and alternatives for the proposed anesthesia with the patient or authorized representative who has indicated his/her understanding and acceptance.     Dental advisory given  Plan Discussed with: CRNA, Surgeon and Anesthesiologist  Anesthesia Plan Comments:         Anesthesia  Quick Evaluation

## 2019-09-08 NOTE — Transfer of Care (Signed)
Immediate Anesthesia Transfer of Care Note  Patient: Marc Hebert  Procedure(s) Performed: Revision amputation right long finger (Right Finger)  Patient Location: PACU  Anesthesia Type:General  Level of Consciousness: awake, alert  and oriented  Airway & Oxygen Therapy: Patient Spontanous Breathing and Patient connected to face mask oxygen  Post-op Assessment: Report given to RN and Post -op Vital signs reviewed and stable  Post vital signs: Reviewed and stable  Last Vitals:  Vitals Value Taken Time  BP    Temp    Pulse 94 09/08/19 1552  Resp 18 09/08/19 1552  SpO2 98 % 09/08/19 1552  Vitals shown include unvalidated device data.  Last Pain:  Vitals:   09/08/19 1255  TempSrc: Oral  PainSc: 6       Patients Stated Pain Goal: 6 (09/08/19 1255)  Complications: No complications documented.

## 2019-09-08 NOTE — Brief Op Note (Signed)
09/08/2019  4:07 PM  PATIENT:  Marc Hebert  29 y.o. male  PRE-OPERATIVE DIAGNOSIS:  Displaced Fracture Of Middle Phalanx Of Right Middle Finger  POST-OPERATIVE DIAGNOSIS:  Displaced Fracture Of Middle Phalanx Of Right Middle Finger  PROCEDURE:  Procedure(s): Revision amputation right long finger (Right)  SURGEON:  Surgeon(s) and Role:    * Laurance Heide, Wendy Poet, MD - Primary  PHYSICIAN ASSISTANT: Materials engineer, PA  ASSISTANTS: none   ANESTHESIA:   general  EBL:  5 mL   BLOOD ADMINISTERED:none  DRAINS: none   LOCAL MEDICATIONS USED:  MARCAINE     SPECIMEN:  No Specimen  DISPOSITION OF SPECIMEN:  PATHOLOGY  COUNTS:  YES  TOURNIQUET:   Total Tourniquet Time Documented: Upper Arm (Right) - 17 minutes Total: Upper Arm (Right) - 17 minutes   DICTATION: .Reubin Milan Dictation  PLAN OF CARE: Discharge to home after PACU  PATIENT DISPOSITION:  PACU - hemodynamically stable.   Delay start of Pharmacological VTE agent (>24hrs) due to surgical blood loss or risk of bleeding: not applicable

## 2019-09-08 NOTE — Telephone Encounter (Signed)
Patient's wife called to state that his discharge papers state that he has been prescribed hydrocodone for post op pain; however this is not effective for him and they have requested the prescription be changed to oxycodone.

## 2019-09-08 NOTE — Discharge Instructions (Addendum)
Activity as tolerated.  Avoid getting dressing wet. NO driving No heavy activities  Diet: Regular  Wound Care: Keep dressing clean & dry Do not change dressings for 2 days. You can then cover with gauze or bandage.  Special Instructions:  Call Doctor if any unusal problems occur such as pain, excessive Bleeding, unrelieved Nausea/vomiting, Fever &/or chills  Post-op pain meds (norco x 5 days sent to pharmacy).   Post Anesthesia Home Care Instructions  Activity: Get plenty of rest for the remainder of the day. A responsible individual must stay with you for 24 hours following the procedure.  For the next 24 hours, DO NOT: -Drive a car -Advertising copywriter -Drink alcoholic beverages -Take any medication unless instructed by your physician -Make any legal decisions or sign important papers.  Meals: Start with liquid foods such as gelatin or soup. Progress to regular foods as tolerated. Avoid greasy, spicy, heavy foods. If nausea and/or vomiting occur, drink only clear liquids until the nausea and/or vomiting subsides. Call your physician if vomiting continues.  Special Instructions/Symptoms: Your throat may feel dry or sore from the anesthesia or the breathing tube placed in your throat during surgery. If this causes discomfort, gargle with warm salt water. The discomfort should disappear within 24 hours.  If you had a scopolamine patch placed behind your ear for the management of post- operative nausea and/or vomiting:  1. The medication in the patch is effective for 72 hours, after which it should be removed.  Wrap patch in a tissue and discard in the trash. Wash hands thoroughly with soap and water. 2. You may remove the patch earlier than 72 hours if you experience unpleasant side effects which may include dry mouth, dizziness or visual disturbances. 3. Avoid touching the patch. Wash your hands with soap and water after contact with the patch.

## 2019-09-08 NOTE — Op Note (Signed)
Operative Note   DATE OF OPERATION: 09/08/2019  SURGICAL DEPARTMENT: Plastic Surgery  PREOPERATIVE DIAGNOSES: Right long finger pain  POSTOPERATIVE DIAGNOSES:  same  PROCEDURE: Revision amputation right long finger  SURGEON: Ancil Linsey, MD  ASSISTANT: Zadie Cleverly, PA The advanced practice practitioner (APP) assisted throughout the case.  The APP was essential in retraction and counter traction when needed to make the case progress smoothly.  This retraction and assistance made it possible to see the tissue plans for the procedure.  The assistance was needed for blood control, tissue re-approximation and assisted with closure of the incision site.  ANESTHESIA:  General.   COMPLICATIONS: None.   INDICATIONS FOR PROCEDURE:  The patient, Marc Hebert is a 29 y.o. male born on 11-06-1990, is here for treatment of right long finger pain.  He had a crush injury requiring amputation of the long finger.  I did leave a small portion of the middle phalanx at that initial amputation.  He is having pain and may be having some bony erosion through the skin to the distal aspect of the finger.  He has quite a bit of sensitivity.  He is asked me to revise this back to the PIP joint which I think is reasonable given that there is really only a centimeter or centimeter and half of the middle phalanx left. MRN: [id number]  CONSENT:  Informed consent was obtained directly from the patient. Risks, benefits and alternatives were fully discussed. Specific risks including but not limited to bleeding, infection, hematoma, seroma, scarring, pain, contracture, asymmetry, wound healing problems, and need for further surgery were all discussed. The patient did have an ample opportunity to have questions answered to satisfaction.   DESCRIPTION OF PROCEDURE:  The patient was taken to the operating room. SCDs were placed and Ancef antibiotics were given.  General anesthesia was administered.  The  patient's operative site was prepped and draped in a sterile fashion. A time out was performed and all information was confirmed to be correct.  Started by exsanguinating the arm with Esmarch and inflated the tourniquet to 250 mmHg.  A fishmouth incision was made around the distal aspect of the long finger.  I elevated the flaps at the level of the bone back to the PIP joint level.  The bone then skin remnant were removed.  A rongeur was used to debride the cartilage and smooth out the distal aspect of the proximal phalanx.  Tenotomies were used to dissect out the digital nerves on either side and they were trimmed back a centimeter so.  Closure was done with 4-0 chromic sutures.  Soft dressing was applied.  Tourniquet was let down and tourniquet time was about 17 minutes.  I should note that digital block was performed at the beginning of the case for postoperative analgesia.  The patient tolerated the procedure well.  There were no complications. The patient was allowed to wake from anesthesia, extubated and taken to the recovery room in satisfactory condition.

## 2019-09-08 NOTE — Telephone Encounter (Signed)
Pharmacy called, rx cancelled for norco.

## 2019-09-09 ENCOUNTER — Ambulatory Visit: Payer: Self-pay | Admitting: Occupational Therapy

## 2019-09-09 ENCOUNTER — Encounter (HOSPITAL_BASED_OUTPATIENT_CLINIC_OR_DEPARTMENT_OTHER): Payer: Self-pay | Admitting: Plastic Surgery

## 2019-09-09 ENCOUNTER — Telehealth: Payer: Self-pay

## 2019-09-09 NOTE — Telephone Encounter (Addendum)
Returned patients call. Advised him Matt our PA  recommend taking Tylenol extra strength 500 mg or ibuprofen between doses of oxycodone to assist with pain control.  Also advise patient that he can remain out of work for a few additional days, up to approximately 1 week depending on symptoms and function.  He can not  return to work while still taking narcotics for pain control.  Do not take over 3000 mg of Tylenol in a 24-hour period, he can take 500mg  of tylenol every 6 hours.  Patient can take 600 mg of ibuprofen every 8 hours as needed  Patient was very adamant in getting stronger medication. I advise him this is what we instruct with all patient who have under gone surgery who need additional pain control. He requested Percocet, which works better for him. I advised patient percocet contains oxycodone. He is now requesting a higher amount of dosage.  Patient states if  he had to, he would go to ED for stronger medication.  Patient said he was going to work tomorrow since nothing was helping. I strongly advised not to go to work while taking narcotics as well as driving. He was very angry and said he would contact his attorney.

## 2019-09-09 NOTE — Telephone Encounter (Addendum)
Please advise patient that I would recommend taking Tylenol extra strength 500 mg or ibuprofen between doses of oxycodone to assist with pain control.  Also advise patient that he can remain out of work for a few additional days, up to approximately 1 week depending on symptoms and function.  I would not want him to return to work while still taking narcotics for pain control.  Do not take over 3000 mg of Tylenol in a 24-hour period, he can take 500mg  of tylenol every 6 hours.  Patient can take 600 mg of ibuprofen every 8 hours as needed

## 2019-09-09 NOTE — Telephone Encounter (Signed)
Called patient to discuss postop pain control. He reports that approximately four hours ago he took three of the oxycodone 5mg  IR. He reports that the pain is more of a stinging pain than a throbbing pain. He reports that he typically does well with Percocet and report that he has never taken oxycodone previously. He reports that he has taken a Tylenol as well. He also reports that he would like to return to work soon and would like to  know when he can return. I discussed with the patient that oxycodone is a similar drug to Percocet just without Tylenol and that he can take Tylenol in addition to the oxycodone as stated previously. He reports that he noticed increased pain this morning after waking up and feels as if it was after the numbing medication wore off.    I discussed with him that he could take two of the oxycodone 5 mg IR's at his next dosage But do n ot exceed two at a time. I discussed with him that he should take Tylenol with that dose and to keep track of how much Tylenol he is taking into a void over 3000 mg of Tylenol in one day. I also discussed with the patient that he can also take ibuprofen as needed for pain control. He would like to return to work. I discussed with him that returning to work while taking narcotics was not something I recommend. I feel as if he should wait  to return to work until later this week until his pain is better controlled on only Tylenol and ibuprofen.   Patient agreed and understood. He would need a letter to stay out of work. I informed him the front desk staff at our office can send him a letter tomorrow through my chart.

## 2019-09-09 NOTE — Telephone Encounter (Signed)
Patient called back regarding pain medication and also wanted to find out when he should return to work. His work note says 09/10/2019, but he wanted to find out for sure. Please call him to advise.[phone]

## 2019-09-09 NOTE — Telephone Encounter (Signed)
Patient called to state he has experienced no pain relief despite taking 2 oxycodone tablets. He wants to know if something else can be prescribed.

## 2019-09-09 NOTE — Telephone Encounter (Deleted)
Advised patient to follow directions on prescription bottle for ibuprofen or Tylenol.  Do not take over 3000 mg of Tylenol in a 24-hour period.  Patient can take 600 mg of ibuprofen every 8 hours as needed

## 2019-09-10 ENCOUNTER — Telehealth: Payer: Self-pay | Admitting: Occupational Therapy

## 2019-09-10 ENCOUNTER — Ambulatory Visit: Payer: Self-pay | Admitting: Occupational Therapy

## 2019-09-10 NOTE — Telephone Encounter (Signed)
Will be up to him if he wants more therapy.  Potentially may want to come for desentization type treatment as he was very tender as distal aspect of long finger.  We'll see how he does.  Thanks.

## 2019-09-10 NOTE — Telephone Encounter (Signed)
Dr. Arita Miss, We have been seeing Marc Hebert for occupational therapy  I see he underwent surgery for revision of the right middle finger amputation.   Will he need additional occupational therapy?  Should we wait until his post op follow up to see him? Please advise with any new orders/ precautions.  Sincerely, Keene Breath, OTR/L

## 2019-09-11 ENCOUNTER — Encounter: Payer: Self-pay | Admitting: Surgical

## 2019-09-15 ENCOUNTER — Ambulatory Visit: Payer: Self-pay | Admitting: Occupational Therapy

## 2019-09-17 ENCOUNTER — Ambulatory Visit: Payer: Self-pay | Admitting: *Deleted

## 2019-09-22 ENCOUNTER — Encounter: Payer: Self-pay | Admitting: Occupational Therapy

## 2019-09-24 ENCOUNTER — Encounter: Payer: Self-pay | Admitting: *Deleted

## 2019-09-25 ENCOUNTER — Encounter: Payer: Self-pay | Admitting: Plastic Surgery

## 2019-09-25 ENCOUNTER — Other Ambulatory Visit: Payer: Self-pay

## 2019-09-25 ENCOUNTER — Ambulatory Visit (INDEPENDENT_AMBULATORY_CARE_PROVIDER_SITE_OTHER): Payer: Worker's Compensation | Admitting: Plastic Surgery

## 2019-09-25 VITALS — BP 155/85 | HR 110 | Temp 98.9°F

## 2019-09-25 DIAGNOSIS — S62622B Displaced fracture of medial phalanx of right middle finger, initial encounter for open fracture: Secondary | ICD-10-CM

## 2019-09-25 NOTE — Progress Notes (Signed)
Patient is a couple weeks postop from revision amputation of the long finger.  He feels like things are going well.  He does not have any of the hypersensitivity that he was feeling before.  On examination his incision incision is healing fine.  At this point he has been back at work with sleeve around the amputation stump and that is going well for him.  The ring finger continues to be a little bit stiff but he feels like the function is good enough that he would rather not mess with it at this point.  I will plan to see him again on an as-needed basis.

## 2021-02-28 ENCOUNTER — Emergency Department (HOSPITAL_COMMUNITY)
Admission: EM | Admit: 2021-02-28 | Discharge: 2021-02-28 | Disposition: A | Discharge status: Home / Self Care | Payer: PRIVATE HEALTH INSURANCE | Attending: Emergency Medicine | Admitting: Emergency Medicine

## 2021-02-28 ENCOUNTER — Encounter (HOSPITAL_COMMUNITY): Payer: Self-pay

## 2021-02-28 DIAGNOSIS — K029 Dental caries, unspecified: Secondary | ICD-10-CM

## 2021-02-28 DIAGNOSIS — K0889 Other specified disorders of teeth and supporting structures: Secondary | ICD-10-CM | POA: Diagnosis present

## 2021-02-28 DIAGNOSIS — K047 Periapical abscess without sinus: Secondary | ICD-10-CM | POA: Diagnosis not present

## 2021-02-28 DIAGNOSIS — R030 Elevated blood-pressure reading, without diagnosis of hypertension: Secondary | ICD-10-CM

## 2021-02-28 MED ORDER — AMOXICILLIN 500 MG PO CAPS
1000.0000 mg | ORAL_CAPSULE | Freq: Once | ORAL | Status: AC
  Administered 2021-02-28: 1000 mg via ORAL
  Filled 2021-02-28: qty 2

## 2021-02-28 MED ORDER — AMOXICILLIN 500 MG PO CAPS: 500.0000 mg | ORAL_CAPSULE | Freq: Three times a day (TID) | ORAL | 0 refills | Status: DC

## 2021-02-28 MED ORDER — ACETAMINOPHEN 500 MG PO TABS
1000.0000 mg | ORAL_TABLET | Freq: Once | ORAL | Status: AC
  Administered 2021-02-28: 1000 mg via ORAL
  Filled 2021-02-28: qty 2

## 2021-02-28 MED ORDER — IBUPROFEN 600 MG PO TABS: 600.0000 mg | ORAL_TABLET | Freq: Three times a day (TID) | ORAL | 0 refills | Status: DC | PRN

## 2021-02-28 MED ORDER — IBUPROFEN 400 MG PO TABS
400.0000 mg | ORAL_TABLET | Freq: Once | ORAL | Status: AC
  Administered 2021-02-28: 400 mg via ORAL
  Filled 2021-02-28: qty 1

## 2021-02-28 NOTE — ED Triage Notes (Addendum)
Pt states pain in right side wisdom tooth since yesterday. Pt has hx of same. Pt states no relief with ora-jel. Pt states he does not have dental insurance to get it removed.

## 2021-02-28 NOTE — ED Provider Notes (Signed)
[name] EMERGENCY DEPARTMENT Provider Note   CSN: [id number] Arrival date & time: 02/28/21  [id number]     History  Chief Complaint  Patient presents with   Dental Pain    Marc Hebert is a 31 y.o. male.  Patient c/o right lower dental pain. Symptoms acute onset in past couple days, constant, dull, mod-severe, non radiating, worse w chewing. Does not currently have dentist. No trauma to face/head. No fever or chills. No sore throat. No trouble breathing or swallowing. No recent meds for same.   The history is provided by the patient and medical records.  Dental Pain Associated symptoms: no fever, no headaches and no neck pain       Home Medications Prior to Admission medications   Not on File      Allergies    Patient has no known allergies.    Review of Systems   Review of Systems  Constitutional:  Negative for chills and fever.  HENT:  Negative for sore throat and trouble swallowing.   Eyes:  Negative for visual disturbance.  Respiratory:  Negative for shortness of breath.   Cardiovascular:  Negative for chest pain.  Gastrointestinal:  Negative for nausea and vomiting.  Musculoskeletal:  Negative for neck pain and neck stiffness.  Skin:  Negative for rash.  Neurological:  Negative for weakness, numbness and headaches.   Physical Exam Updated Vital Signs BP (!) 183/109 (BP Location: Left Arm)    Pulse 82    Temp 98.5 F (36.9 C) (Oral)    Resp 16    Ht 1.93 m (6\' 4" )    Wt 102.1 kg    SpO2 99%    BMI 27.39 kg/m  Physical Exam Vitals and nursing note reviewed.  Constitutional:      Appearance: Normal appearance. He is well-developed.  HENT:     Head: Atraumatic.     Nose: Nose normal.     Mouth/Throat:     Mouth: Mucous membranes are moist.     Pharynx: Oropharynx is clear. No oropharyngeal exudate or posterior oropharyngeal erythema.     Comments: Right posterior molar is decayed, partially broken, with associated gum swelling. No  fluctuance noted. No trismus. Pharynx appears normal. No pain, swelling or tenderness to floor of mouth or neck. No gross facial swelling noted.  Eyes:     General: No scleral icterus.    Conjunctiva/sclera: Conjunctivae normal.  Neck:     Trachea: No tracheal deviation.     Comments: No l/a. No stiffness or rigidity. Trachea midline. No gross neck swelling noted.  Cardiovascular:     Rate and Rhythm: Normal rate.     Pulses: Normal pulses.  Pulmonary:     Effort: Pulmonary effort is normal. No accessory muscle usage or respiratory distress.     Breath sounds: No stridor.  Genitourinary:    Comments: No cva tenderness. Musculoskeletal:        General: No swelling.     Cervical back: Normal range of motion and neck supple. No rigidity or tenderness.  Lymphadenopathy:     Cervical: No cervical adenopathy.  Skin:    General: Skin is warm and dry.     Findings: No rash.  Neurological:     Mental Status: He is alert.     Comments: Alert, speech clear, no dysphonia.   Psychiatric:        Mood and Affect: Mood normal.    ED Results / Procedures / Treatments  Labs (all labs ordered are listed, but only abnormal results are displayed) Labs Reviewed - No data to display  EKG None  Radiology No results found.  Procedures Procedures    Medications Ordered in ED Medications - No data to display  ED Course/ Medical Decision Making/ A&P                           Medical Decision Making Problems Addressed: Dental abscess: acute illness or injury Dental caries: acute illness or injury Elevated blood pressure reading: chronic illness or injury with exacerbation, progression, or side effects of treatment    Details: ?exacerbated due to pain. prior bps appear in 140-150/ range. rec close f/u. Pain, dental: acute illness or injury  Amount and/or Complexity of Data Reviewed External Data Reviewed: labs, radiology and notes.  Risk OTC drugs. Prescription drug  management.   Pt c/o severe dental pain. Considered opiate/parenteral med use for symptom management, however, by self/no other ride, will initiate pain relief with oral meds.   Confirmed nkda w pt. Amoxicillin po. Acetaminophen po, ibuprofen po.   Reviewed nursing notes and prior charts for additional history. External reports reviewed.   Discussed need close dental f/u - will provide info/guide.   Also discussed closed pcp f/u re elevated bp.   Return precautions provided.           Final Clinical Impression(s) / ED Diagnoses Final diagnoses:  None    Rx / DC Orders ED Discharge Orders     None         Marc Saver, MD 02/28/21 [phone]

## 2021-02-28 NOTE — Discharge Instructions (Signed)
It was our pleasure to provide your ER care today - we hope that you feel better.  Take antibiotic as prescribed (amoxicillin). Take acetaminophen and ibuprofen as need for pain.   Follow up closely with dentist in the next few days - call today to arrange appointment.   Your blood pressure is high today - follow up with primary care doctor in the coming week.  Return to ER if worse, new symptoms, fevers, increased facial or neck swelling, trouble breathing or swallowing, or other concern.

## 2022-04-11 ENCOUNTER — Encounter: Payer: Self-pay | Admitting: Internal Medicine

## 2022-04-11 ENCOUNTER — Ambulatory Visit (INDEPENDENT_AMBULATORY_CARE_PROVIDER_SITE_OTHER): Payer: PRIVATE HEALTH INSURANCE | Admitting: Internal Medicine

## 2022-04-11 VITALS — BP 144/98 | HR 88 | Ht 76.0 in | Wt 262.0 lb

## 2022-04-11 DIAGNOSIS — Z0001 Encounter for general adult medical examination with abnormal findings: Secondary | ICD-10-CM | POA: Diagnosis not present

## 2022-04-11 DIAGNOSIS — R21 Rash and other nonspecific skin eruption: Secondary | ICD-10-CM | POA: Insufficient documentation

## 2022-04-11 DIAGNOSIS — R030 Elevated blood-pressure reading, without diagnosis of hypertension: Secondary | ICD-10-CM

## 2022-04-11 NOTE — Assessment & Plan Note (Signed)
Patient has a erythematous patches which are well-demarcated on his chest and sometimes spreads to his back.  It is not pruritic.  He has lived with this for approximately 8 months, will not start any treatment and refer to dermatology.

## 2022-04-11 NOTE — Patient Instructions (Addendum)
Thank you for trusting me with your care. To recap, today we discussed the following:   Rash on your chest and back Referral to dermatology  Elevated Blood pressure Goal is <140/80 -I recommend checking your blood pressure at home, if staying elevated follow-up with your primary care physician - Bring 7-10 readings to your next visit for your doctor to evaluate  As mentioned, the DASH diet (low salt) is recommended to help your blood pressure Regular physical activity is one of the most important things you can do for your health and lower blood pressure. I recommend at least 150 minutes of moderate-intensity physical activity a week for adults. You might split that into 30 minutes, 5 days a week.

## 2022-04-11 NOTE — Assessment & Plan Note (Signed)
Patient's initial blood pressure 155/103, second blood pressure 144/98.  He has access to a home blood pressure monitor.  -Counseled on lifestyle including DASH diet and increasing exercise.  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 -Recommend ambulatory blood pressure monitoring and follow-up his blood pressure readings today greater than 140/80

## 2022-04-11 NOTE — Progress Notes (Unsigned)
     HPI:[name] is a 32 y.o. male who is not currently treated for any chronic medical conditions who presents to establish care and evaluation of rash.  Patient is had a rash on his chest since last July.  The rash is not pruritic . He does not recall anything that changed at that time.  He has been using again to wash his close for approximately 8 years.  No new jewelry.  He has not noticed that this rash worsens with sun exposure or food.  No other joint pain, throat swelling, or shortness of breath.    History reviewed. No pertinent past medical history.   Past Surgical History:  Procedure Laterality Date   AMPUTATION Right 09/08/2019   Procedure: Revision amputation right long finger;  Surgeon: Cindra Presume, MD;  Location: [place];  Service: Plastics;  Laterality: Right;   I & D EXTREMITY Right 04/10/2019   Procedure: IRRIGATION AND DEBRIDEMENT OF RIGHT LONG AND RING FINGERS .;  Surgeon: Cindra Presume, MD;  Location: [place];  Service: Plastics;  Laterality: Right;   PERCUTANEOUS PINNING Right 04/10/2019   Procedure: PERCUTANEOUS PINNING RIGHT LONG AND RING FINGERS.;  Surgeon: Cindra Presume, MD;  Location: [place];  Service: Plastics;  Laterality: Right;    Family History  Problem Relation Age of Onset   Hypertension Mother    Hypertension Father    Hypertension Brother    Hypertension Other     Social History   Tobacco Use   Smoking status: Former    Packs/day: 2.00    Years: 5.00    Additional pack years: 0.00    Total pack years: 10.00    Types: Cigarettes, E-cigarettes   Smokeless tobacco: Former    Types: Nurse, children's Use: Every day  Substance Use Topics   Alcohol use: Yes    Comment: 2x week   Drug use: No     Physical Exam: Vitals:   04/11/22 1001 04/11/22 1036  BP: (!) 155/103 (!) 144/98  Pulse: 88   SpO2: 95%   Weight: 262 lb 0.6 oz (118.9 kg)   Height: 6\' 4"  (1.93 m)      Physical  Exam Constitutional:      General: He is not in acute distress.    Appearance: He is obese. He is not ill-appearing.  Cardiovascular:     Rate and Rhythm: Normal rate and regular rhythm.  Pulmonary:     Effort: Pulmonary effort is normal. No respiratory distress.     Breath sounds: No wheezing.  Skin:    Findings: Rash (Erythematous patch covering chest and top of abdomen, on patient's back, well-demarcated) present.  Psychiatric:        Behavior: Behavior normal.      Assessment & Plan:   Elevated blood pressure reading Patient's initial blood pressure 155/103, second blood pressure 144/98.  He has access to a home blood pressure monitor.  -Counseled on lifestyle including DASH diet and increasing exercise.  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  It is not pruritic.  He has lived with this for approximately 8 months, will not start any treatment and refer to dermatology.    Lorene Dy, MD

## 2022-04-12 DIAGNOSIS — Z0001 Encounter for general adult medical examination with abnormal findings: Secondary | ICD-10-CM | POA: Insufficient documentation

## 2022-04-12 LAB — CMP14+EGFR
ALT: 21 IU/L (ref 0–44)
AST: 18 IU/L (ref 0–40)
Albumin/Globulin Ratio: 1.6 (ref 1.2–2.2)
Albumin: 4.5 g/dL (ref 4.3–5.2)
Alkaline Phosphatase: 68 IU/L (ref 44–121)
BUN/Creatinine Ratio: 11 (ref 9–20)
BUN: 11 mg/dL (ref 6–20)
Bilirubin Total: 0.2 mg/dL (ref 0.0–1.2)
CO2: 24 mmol/L (ref 20–29)
Calcium: 9.9 mg/dL (ref 8.7–10.2)
Chloride: 100 mmol/L (ref 96–106)
Creatinine, Ser: 1.03 mg/dL (ref 0.76–1.27)
Globulin, Total: 2.9 g/dL (ref 1.5–4.5)
Glucose: 116 mg/dL — ABNORMAL HIGH (ref 70–99)
Potassium: 4.6 mmol/L (ref 3.5–5.2)
Sodium: 139 mmol/L (ref 134–144)
Total Protein: 7.4 g/dL (ref 6.0–8.5)
eGFR: 100 mL/min/{1.73_m2} (ref >59)

## 2022-04-12 LAB — CBC WITH DIFFERENTIAL/PLATELET
Basophils Absolute: 0 10*3/uL (ref 0.0–0.2)
Basos: 1 %
EOS (ABSOLUTE): 0.1 10*3/uL (ref 0.0–0.4)
Eos: 2 %
Hematocrit: 50.1 % (ref 37.5–51.0)
Hemoglobin: 17 g/dL (ref 13.0–17.7)
Immature Grans (Abs): 0 10*3/uL (ref 0.0–0.1)
Immature Granulocytes: 1 %
Lymphocytes Absolute: 1.5 10*3/uL (ref 0.7–3.1)
Lymphs: 23 %
MCH: 30.2 pg (ref 26.6–33.0)
MCHC: 33.9 g/dL (ref 31.5–35.7)
MCV: 89 fL (ref 79–97)
Monocytes Absolute: 0.6 10*3/uL (ref 0.1–0.9)
Monocytes: 10 %
Neutrophils Absolute: 4.4 10*3/uL (ref 1.4–7.0)
Neutrophils: 63 %
Platelets: 214 10*3/uL (ref 150–450)
RBC: 5.62 x10E6/uL (ref 4.14–5.80)
RDW: 12.4 % (ref 11.6–15.4)
WBC: 6.8 10*3/uL (ref 3.4–10.8)

## 2022-04-12 LAB — LIPID PANEL
Chol/HDL Ratio: 4.6 ratio (ref 0.0–5.0)
Cholesterol, Total: 226 mg/dL — ABNORMAL HIGH (ref 100–199)
HDL: 49 mg/dL (ref >39)
LDL Chol Calc (NIH): 134 mg/dL — ABNORMAL HIGH (ref 0–99)
Triglycerides: 242 mg/dL — ABNORMAL HIGH (ref 0–149)
VLDL Cholesterol Cal: 43 mg/dL — ABNORMAL HIGH (ref 5–40)

## 2022-04-12 LAB — HEMOGLOBIN A1C
Est. average glucose Bld gHb Est-mCnc: 117 mg/dL
Hgb A1c MFr Bld: 5.7 % — ABNORMAL HIGH (ref 4.8–5.6)

## 2022-04-12 LAB — VITAMIN D 25 HYDROXY (VIT D DEFICIENCY, FRACTURES): Vit D, 25-Hydroxy: 22.5 ng/mL — ABNORMAL LOW (ref 30.0–100.0)

## 2022-04-12 LAB — TSH: TSH: 2.28 u[IU]/mL (ref 0.450–4.500)

## 2022-04-12 NOTE — Assessment & Plan Note (Signed)
Patient is 32 years old , BMI 31. Will check baseline CMP, CBC, Lipid panel, TSH, and Vitamin D.

## 2023-12-10 ENCOUNTER — Ambulatory Visit (HOSPITAL_BASED_OUTPATIENT_CLINIC_OR_DEPARTMENT_OTHER)
